# Patient Record
Sex: Female | Born: 1985 | Race: White | Hispanic: No | Marital: Single | Smoking: Former smoker
Health system: Southern US, Community
[De-identification: ages and names within clinical notes are randomized; demographics above are authoritative.]

## PROBLEM LIST (undated history)

## (undated) DIAGNOSIS — Q2730 Arteriovenous malformation, site unspecified: Secondary | ICD-10-CM

## (undated) DIAGNOSIS — G43909 Migraine, unspecified, not intractable, without status migrainosus: Secondary | ICD-10-CM

## (undated) HISTORY — PX: DILATION AND CURETTAGE OF UTERUS: SHX78

---
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
## 2006-11-03 ENCOUNTER — Inpatient Hospital Stay (HOSPITAL_COMMUNITY): Admission: AD | Admit: 2006-11-03 | Discharge: 2006-11-06 | Payer: Self-pay | Admitting: Obstetrics and Gynecology

## 2009-11-06 ENCOUNTER — Ambulatory Visit (HOSPITAL_COMMUNITY): Admission: RE | Admit: 2009-11-06 | Discharge: 2009-11-06 | Payer: Self-pay | Admitting: Family Medicine

## 2009-11-06 IMAGING — US US BREAST*L*
1 series · 4 of 4 positions shown · non-contrast
Comparison: None.

CLINICAL DATA: Palpable area of nodularity located within the left
breast at the 8 o'clock position.

LEFT BREAST ULTRASOUND

[Series 1: us breast*left* · 0.07mm/px · 4 of 4 slices shown]
[im 1/4]
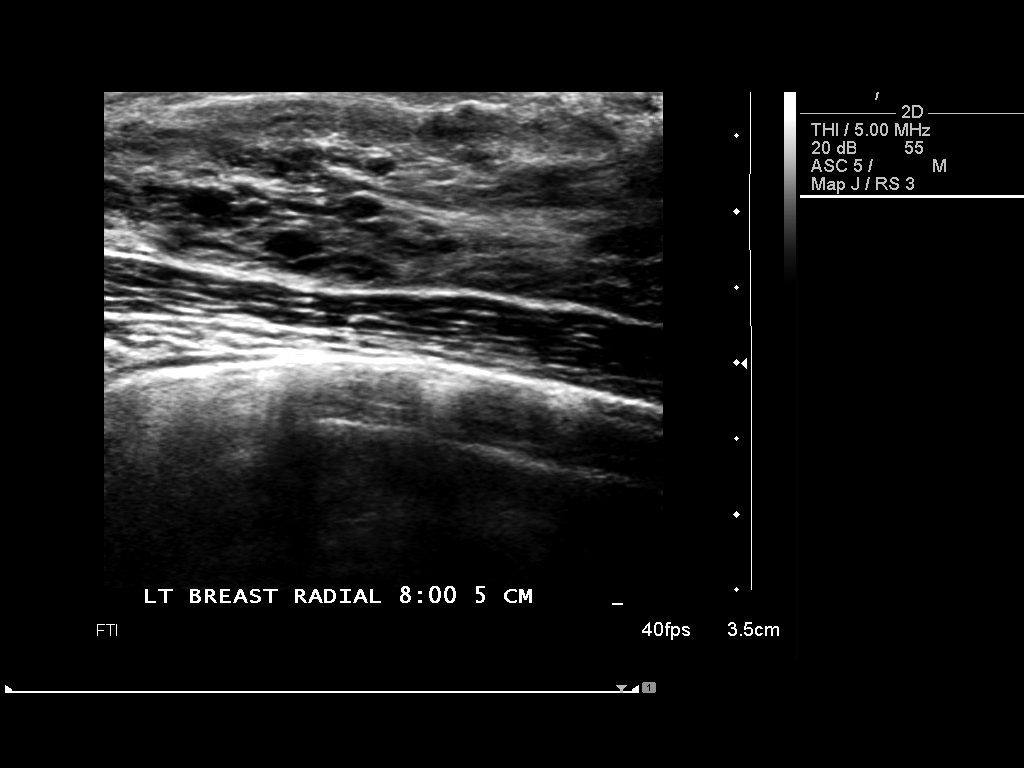
[im 2/4]
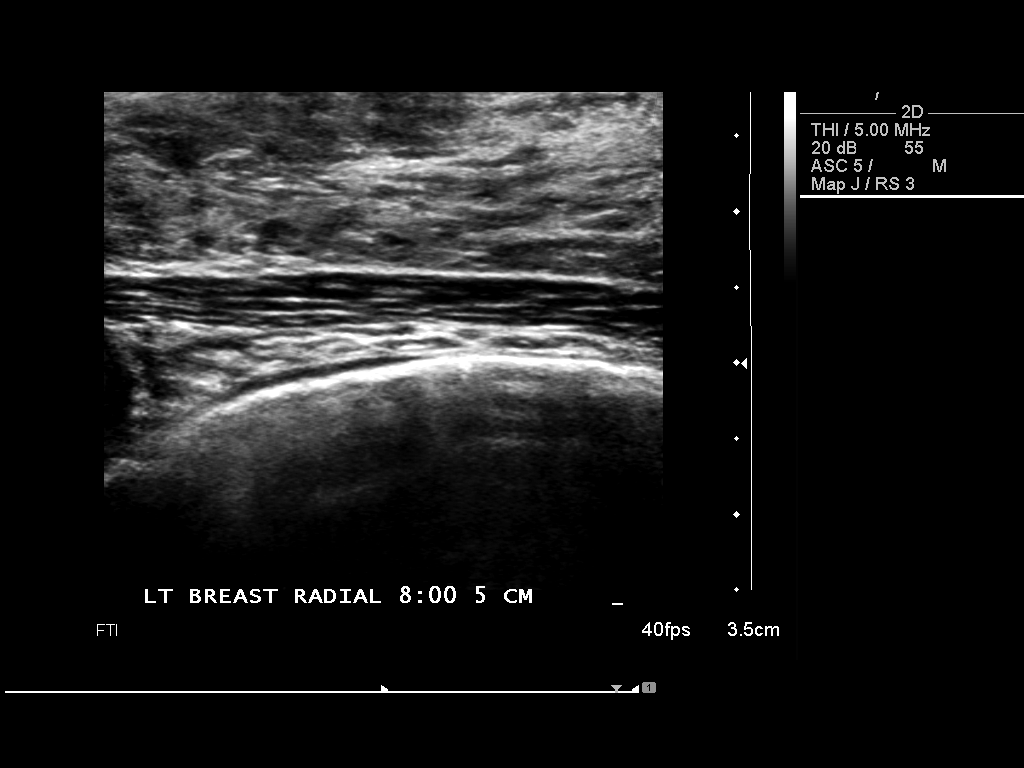
[im 3/4]
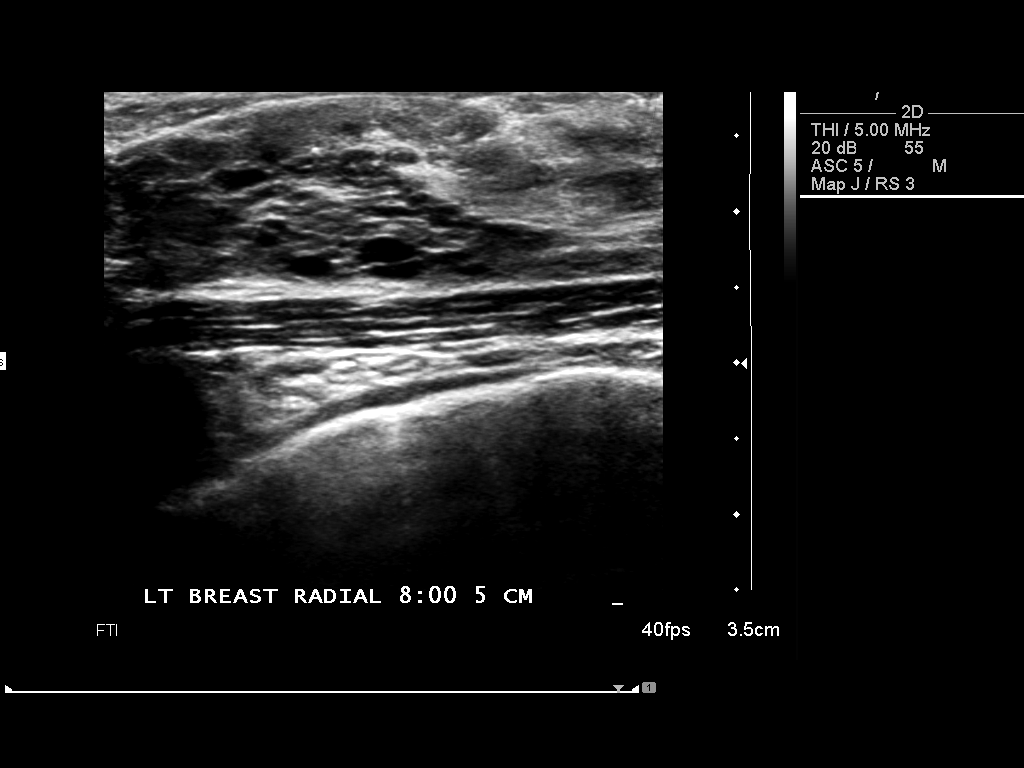
[im 4/4]
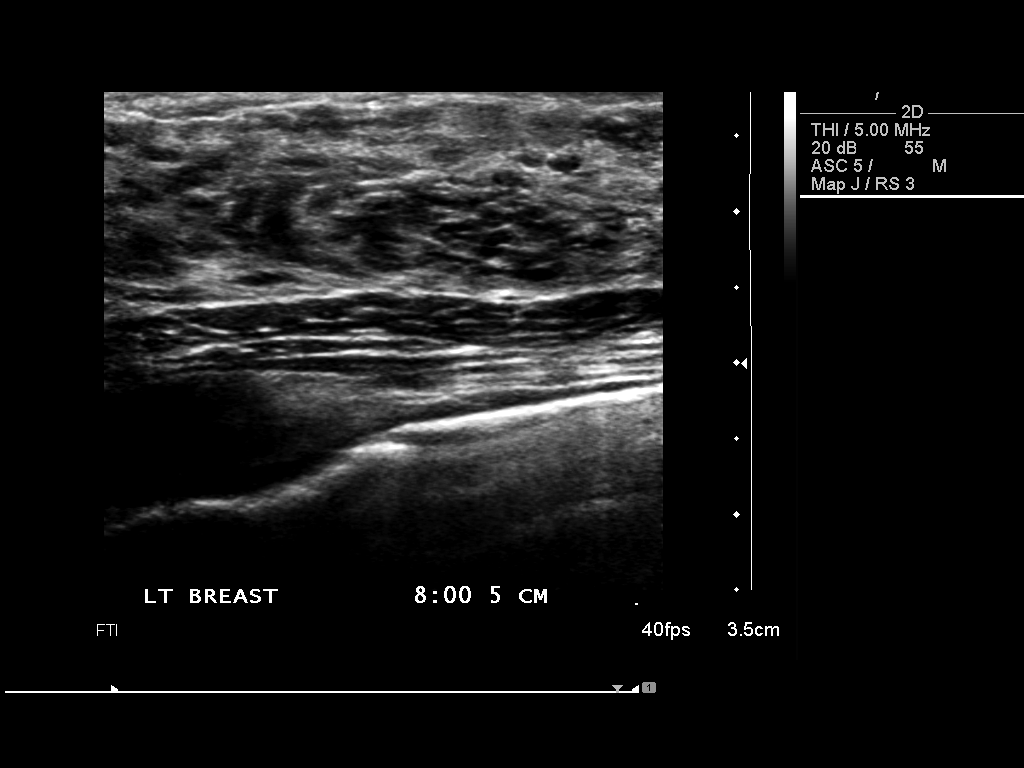

[4 of 4 positions shown; findings below may reference images not displayed]

On physical exam, there is a palpable area of thickening located
within the left breast at the 8 o'clock position 5 cm from the
nipple.
FINDINGS: Ultrasound is performed, showing prominent but normal
appearing fibroglandular tissue within the left breast at the 8
o'clock position in the area of palpable thickening noted
clinically.

The patient does state that this area fluctuates during her
menstrual cycle which would be most consistent with dense prominent
fibroglandular tissue.  There is no mass, distortion, or worrisome
shadowing.
IMPRESSION: No findings worrisome for malignancy.  The area of thickening
corresponds to a prominent fibroglandular tissue.  Recommend
screening mammography at age 41.

BI-RADS CATEGORY 1:  Negative.

## 2011-03-13 NOTE — Op Note (Signed)
NAMEMARELYN, ROUSER            ACCOUNT NO.:  [id number]   MEDICAL RECORD NO.:  [id number]          PATIENT TYPE:  INP   LOCATION:  LDR1                          FACILITY:  [facility]   PHYSICIAN:  Lazaro Arms, M.D.   DATE OF BIRTH:  [birth year]   DATE OF PROCEDURE:  11/04/2006  DATE OF DISCHARGE:                               OPERATIVE REPORT   Whitney [name] is a 25 year old gravida 1, para 0, estimated date of delivery  of October 28, 2006, at [redacted] weeks gestation who was admitted for induction  of labor.  She is now 4 cm dilated and requesting epidural.  She was  placed in a sitting position.  Betadine prep was used.  Lidocaine 1% was  injected in the L3 L4 interspace.  The area was field draped.  A 17  gauge Tuohy needle is used, loss of resistance technique employed in the  epidural space __________ without difficulty.  Bupivacaine plain, 10 mL,  0.125% is given as a test dose without ill effects.  The epidural  catheter is then fed and an additional 10 mL is given to dose up the  epidural.  Epidural catheter is then taped down 5 cm from the epidural  space.  Continuous infusion of 0.125% Bupivacaine with 2 mics/mL of  fentanyl to run at 12 mL an hour.  The patient tolerated it well, she is  getting good pain relief and the fetal heart rate tracing is stable.      Lazaro Arms, M.D.  Electronically Signed     LHE/MEDQ  D:  11/04/2006  T:  11/04/2006  Job:  [id number]

## 2011-03-13 NOTE — H&P (Signed)
NAMEMACKENZEY, CROWNOVER            ACCOUNT NO.:  [id number]   MEDICAL RECORD NO.:  [id number]          PATIENT TYPE:  INP   LOCATION:  A413                          FACILITY:  [facility]   PHYSICIAN:  Lazaro Arms, M.D.   DATE OF BIRTH:  10-02-1986   DATE OF ADMISSION:  11/03/2006  DATE OF DISCHARGE:  LH                              HISTORY & PHYSICAL   Whitney [name] is an 25 year old gravida 1, para 0 with an estimated delivery  of October 28, 2006 at 41 and [redacted] week gestation who is admitted for  induction of labor because of postdates.  Her pregnancy has been  unremarkable.   PAST MEDICAL HISTORY:  Negative.   PAST SURGICAL HISTORY:  Negative.   PAST OBSTETRICAL HISTORY:  She is nulliparous.   ALLERGIES:  NONE.   MEDICATIONS:  Prenatal vitamins.   PRENATAL LABORATORY DATA:  She is O positive, antibody screen negative,  Varicella positive, rubella immune, hepatitis B negative, HIV negative,  HSV 1 and 2 were negative, serologies nonreactive x2, GC and chlamydia  negative x2, Pap normal, Glucola normal, group B strep not listed on the  chart.  We will check on that.   IMPRESSION:  1. Intrauterine pregnancy at [redacted] weeks gestation.  2. Unfavorable cervix.   PLAN:  The patient is admitted for Foley bulb ripening followed by  Pitocin induction of labor.  She understands the indications and will  proceed.      Lazaro Arms, M.D.  Electronically Signed     LHE/MEDQ  D:  11/05/2006  T:  11/05/2006  Job:  [id number]

## 2011-03-13 NOTE — Group Therapy Note (Signed)
NAME:  CARLETTE, PALMATIER            ACCOUNT NO.:  [id number]   MEDICAL RECORD NO.:  [id number]          PATIENT TYPE:  INP   LOCATION:  LDR1                          FACILITY:  [facility]   PHYSICIAN:  Tilda Burrow, M.D. DATE OF BIRTH:  Apr 27, 1986   DATE OF PROCEDURE:  DATE OF DISCHARGE:                                 PROGRESS NOTE   Hilliard finally started progressing through labor and was noted to be  completely dilated at +3 station at approximately 1500.  After a brief  second stage, she had a spontaneous vaginal delivery of a viable female  infant at 1530 hours.  Prior to delivery a midline episiotomy was cut  due to some bradycardia issues with the fetus.  After the episiotomy she  delivered over the next contraction.  The mouth and nose were suctioned  on the perineum and the body delivered without any difficulty.  Apgars  are 9 and 9.  Weight 8 pounds 6 ounces.  20 units of Pitocin diluted in  1000 cc of lactated Ringer's was infused rapidly IV.  The placenta  separated spontaneously and delivered via controlled cord traction and  maternal pushing effort at 1533 hours.  It was inspected and appears to  be intact with a three-vessel cord.  Estimated blood loss 250 cc.  The  vagina was then inspected and no extensions of the episiotomy were  noted.  It was repaired under local anesthesia with a 2-0 Vicryl suture.  The epidural catheter was then removed with the blue tip visualized as  being intact.      Jacklyn Shell, C.N.M.      Tilda Burrow, M.D.  Electronically Signed    FC/MEDQ  D:  11/04/2006  T:  11/04/2006  Job:  [id number]   cc:   [facility]   Francoise Schaumann. Milford Cage DO, FAAP  Fax: [phone]

## 2012-03-21 ENCOUNTER — Encounter (HOSPITAL_COMMUNITY): Payer: Self-pay | Admitting: Emergency Medicine

## 2012-03-21 ENCOUNTER — Emergency Department (INDEPENDENT_AMBULATORY_CARE_PROVIDER_SITE_OTHER)
Admission: EM | Admit: 2012-03-21 | Discharge: 2012-03-21 | Disposition: A | Discharge status: Home / Self Care | Payer: Self-pay | Source: Home / Self Care | Attending: Family Medicine | Admitting: Family Medicine

## 2012-03-21 DIAGNOSIS — S61419A Laceration without foreign body of unspecified hand, initial encounter: Secondary | ICD-10-CM

## 2012-03-21 DIAGNOSIS — S61409A Unspecified open wound of unspecified hand, initial encounter: Secondary | ICD-10-CM

## 2012-03-21 MED ORDER — TETANUS-DIPHTH-ACELL PERTUSSIS 5-2.5-18.5 LF-MCG/0.5 IM SUSP
INTRAMUSCULAR | Status: AC
  Filled 2012-03-21: qty 0.5

## 2012-03-21 MED ORDER — TETANUS-DIPHTH-ACELL PERTUSSIS 5-2.5-18.5 LF-MCG/0.5 IM SUSP
0.5000 mL | Freq: Once | INTRAMUSCULAR | Status: AC
  Administered 2012-03-21: 0.5 mL via INTRAMUSCULAR

## 2012-03-21 MED ORDER — ACETAMINOPHEN-CODEINE #3 300-30 MG PO TABS: 1.0000 | ORAL_TABLET | Freq: Four times a day (QID) | ORAL | Status: AC | PRN

## 2012-03-21 NOTE — Discharge Instructions (Signed)
Keep wound clean and dry. Can remove dressing after 24 hours. Can use soap and water for cleaning. Let dry well before covering. Can applied an over-the-counter antibiotic ointment before covering. Okay to let the wound expose home but lead get wet for prolonged time. Be aware that Tylenol with codeine can make you drowsy and he should not drive after taking this medication. Return in 5-7 days for suture removal or return earlier if redness swelling or drainage.

## 2012-03-21 NOTE — ED Notes (Signed)
Unknown last tetanus
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
## 2012-03-28 ENCOUNTER — Emergency Department (INDEPENDENT_AMBULATORY_CARE_PROVIDER_SITE_OTHER)
Admission: EM | Admit: 2012-03-28 | Discharge: 2012-03-28 | Disposition: A | Discharge status: Home / Self Care | Payer: Self-pay | Source: Home / Self Care

## 2012-03-28 ENCOUNTER — Encounter (HOSPITAL_COMMUNITY): Payer: Self-pay | Admitting: Emergency Medicine

## 2012-03-28 DIAGNOSIS — Z4802 Encounter for removal of sutures: Secondary | ICD-10-CM

## 2012-03-28 NOTE — Discharge Instructions (Signed)
Thank you for coming in today.  Staple Removal Care After The staples used to close your skin have been removed. The wound needs continued care so it can heal completely and without problems. The care described here will need to be done for another 5-10 days unless your caregiver advises otherwise.  HOME CARE INSTRUCTIONS   Keep wound site dry and clean.   If skin adhesive strips were applied after the staples were removed, they will begin to peel off in a few days. If they remain after fourteen days, they may be peeled off and discarded.   If you still have a dressing, change it at least once a day or as instructed by your caregiver. If the bandage sticks, soak it off with warm water. Pat dry with a clean towel. Look for signs of infection (see below).   Reapply cream or ointment according to your caregiver's instruction. This will help prevent infection and keep the bandage from sticking. Use of a non-stick material over the wound and under the dressing or wrap will also help keep the bandage from sticking.   If the bandage becomes wet, dirty or develops a foul smell, change it as soon as possible.   New scars become sunburned easily. Use sunscreens with protection factor (SPF) of at least 15 when out in the sun.   Only take over-the-counter or prescription medicines for pain, discomfort or fever as directed by your caregiver.  SEEK IMMEDIATE MEDICAL CARE IF:   There is redness, swelling or increasing pain in the wound.   Pus is coming from the wound.   An unexplained oral temperature above 102 F (38.9 C) develops.   You notice a foul smell coming from the wound or dressing.   There is a breaking open of the suture line (edges not staying together) of the wound edges after staples have been removed.  Document Released: 09/24/2008 Document Revised: 10/01/2011 Document Reviewed: 09/24/2008 [facility] Patient Information 2012 [place], [place].

## 2012-03-28 NOTE — ED Provider Notes (Signed)
Whitney [name] is a 26 y.o. female who presents to Urgent Care today for suture removal. She suffered a laceration of her hand one week ago which required sutures. In the interim she has done well without any discharge pain or fever.   History  Substance Use Topics  . Smoking status: Current Everyday Smoker  . Smokeless tobacco: Not on file  . Alcohol Use: Yes     occasional   ROS as above Medications reviewed. No current facility-administered medications for this encounter.   Current Outpatient Prescriptions  Medication Sig Dispense Refill  . acetaminophen-codeine (TYLENOL #3) 300-30 MG per tablet Take 1-2 tablets by mouth every 6 (six) hours as needed for pain.  15 tablet  0    Exam:  BP 115/71  Pulse 84  Temp(Src) 98.7 F (37.1 C) (Oral)  Resp 16  SpO2 99% Gen: Well NAD RIGHT HAND: Small well healing laceration of the right interdigital web space between the first and second digits.  Several small interrupted sutures are present.  No discharge or erythema.  Procedure note: Small interrupted sutures were removed and Steri-Strips applied. No bleeding patient tolerated procedure well   Assessment and Plan: 26 y.o. female with suture removal. Performed today. Handout on aftercare provided. Patient expresses understanding     Rodolph Bong, MD 03/28/12 1754

## 2012-03-28 NOTE — ED Notes (Signed)
Pt ready to have stitches removed. No problems noted or reported.

## 2012-03-28 NOTE — ED Provider Notes (Signed)
Medical screening examination/treatment/procedure(s) were performed by a resident physician and as supervising physician I was immediately available for consultation/collaboration.  Leslee Home, M.D.   Reuben Likes, MD 03/28/12 2138

## 2013-05-07 ENCOUNTER — Encounter (HOSPITAL_COMMUNITY): Payer: Self-pay | Admitting: *Deleted

## 2013-05-07 ENCOUNTER — Emergency Department (HOSPITAL_COMMUNITY): Payer: Self-pay

## 2013-05-07 ENCOUNTER — Emergency Department (HOSPITAL_COMMUNITY)
Admission: EM | Admit: 2013-05-07 | Discharge: 2013-05-08 | Disposition: A | Discharge status: Home / Self Care | Payer: Self-pay | Attending: Emergency Medicine | Admitting: Emergency Medicine

## 2013-05-07 DIAGNOSIS — Q2572 Congenital pulmonary arteriovenous malformation: Secondary | ICD-10-CM | POA: Insufficient documentation

## 2013-05-07 DIAGNOSIS — Z3202 Encounter for pregnancy test, result negative: Secondary | ICD-10-CM | POA: Insufficient documentation

## 2013-05-07 DIAGNOSIS — R109 Unspecified abdominal pain: Secondary | ICD-10-CM | POA: Insufficient documentation

## 2013-05-07 DIAGNOSIS — Q2730 Arteriovenous malformation, site unspecified: Secondary | ICD-10-CM

## 2013-05-07 DIAGNOSIS — R3915 Urgency of urination: Secondary | ICD-10-CM | POA: Insufficient documentation

## 2013-05-07 DIAGNOSIS — N390 Urinary tract infection, site not specified: Secondary | ICD-10-CM | POA: Insufficient documentation

## 2013-05-07 DIAGNOSIS — F17200 Nicotine dependence, unspecified, uncomplicated: Secondary | ICD-10-CM | POA: Insufficient documentation

## 2013-05-07 DIAGNOSIS — R300 Dysuria: Secondary | ICD-10-CM | POA: Insufficient documentation

## 2013-05-07 LAB — URINALYSIS, ROUTINE W REFLEX MICROSCOPIC
Glucose, UA: NEGATIVE mg/dL
Ketones, ur: NEGATIVE mg/dL
Protein, ur: NEGATIVE mg/dL
Specific Gravity, Urine: 1.02 (ref 1.005–1.030)
Urobilinogen, UA: 0.2 mg/dL (ref 0.0–1.0)
pH: 7.5 (ref 5.0–8.0)

## 2013-05-07 LAB — URINE MICROSCOPIC-ADD ON

## 2013-05-07 LAB — CBC WITH DIFFERENTIAL/PLATELET
Basophils Absolute: 0.1 10*3/uL (ref 0.0–0.1)
Eosinophils Absolute: 0.3 10*3/uL (ref 0.0–0.7)
HCT: 37.9 % (ref 36.0–46.0)
Hemoglobin: 13.2 g/dL (ref 12.0–15.0)
Lymphocytes Relative: 15 % (ref 12–46)
Lymphs Abs: 1.7 10*3/uL (ref 0.7–4.0)
MCH: 33.5 pg (ref 26.0–34.0)
MCHC: 34.8 g/dL (ref 30.0–36.0)
MCV: 96.2 fL (ref 78.0–100.0)
Monocytes Absolute: 0.8 10*3/uL (ref 0.1–1.0)
Monocytes Relative: 7 % (ref 3–12)
Neutro Abs: 8.3 10*3/uL — ABNORMAL HIGH (ref 1.7–7.7)
Neutrophils Relative %: 74 % (ref 43–77)
Platelets: 217 10*3/uL (ref 150–400)
RBC: 3.94 MIL/uL (ref 3.87–5.11)
RDW: 13.9 % (ref 11.5–15.5)
WBC: 11.2 10*3/uL — ABNORMAL HIGH (ref 4.0–10.5)

## 2013-05-07 LAB — PREGNANCY, URINE: Preg Test, Ur: NEGATIVE

## 2013-05-07 LAB — BASIC METABOLIC PANEL
BUN: 12 mg/dL (ref 6–23)
CO2: 29 mEq/L (ref 19–32)
Chloride: 103 mEq/L (ref 96–112)
Creatinine, Ser: 0.86 mg/dL (ref 0.50–1.10)
GFR calc non Af Amer: >90 mL/min (ref >90)
Glucose, Bld: 97 mg/dL (ref 70–99)
Potassium: 4 mEq/L (ref 3.5–5.1)
Sodium: 139 mEq/L (ref 135–145)

## 2013-05-07 IMAGING — CT CT ABD-PELV W/O CM
2 of 3 series · 9 of 46 positions shown, 11 images · non-contrast
Comparison: None.

CLINICAL DATA: Left flank and abdominal pain with dysuria and
frequency since [DATE].

CT ABDOMEN AND PELVIS WITHOUT CONTRAST
TECHNIQUE: Multidetector CT imaging of the abdomen and pelvis was
performed following the standard protocol without intravenous
contrast.

[Series 4: mpr coronal (id) · coronal · 0.56mm/px · 8 of 75 slices shown, 9 images]
[im 9/75  soft-tissue]
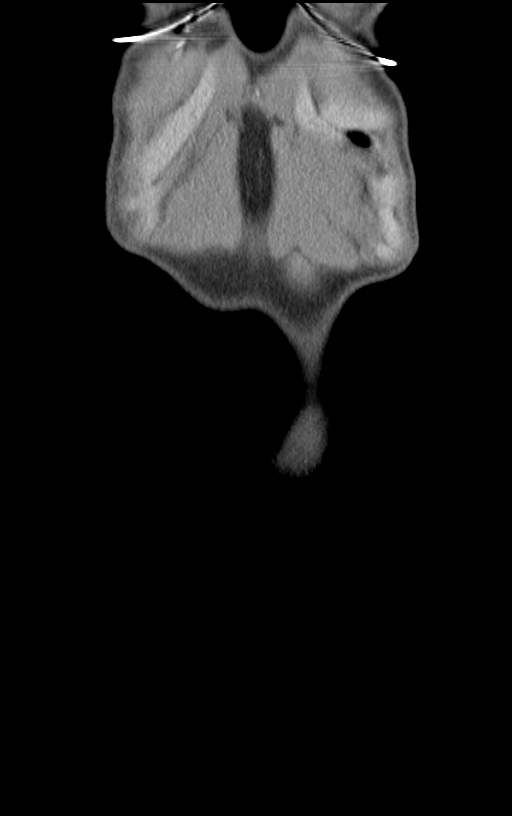
[im 9/75  bone]
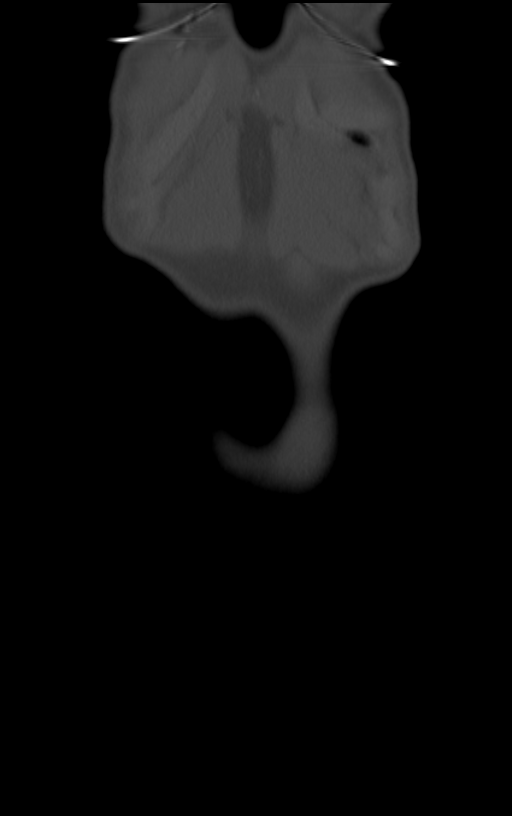
[im 17/75  soft-tissue]
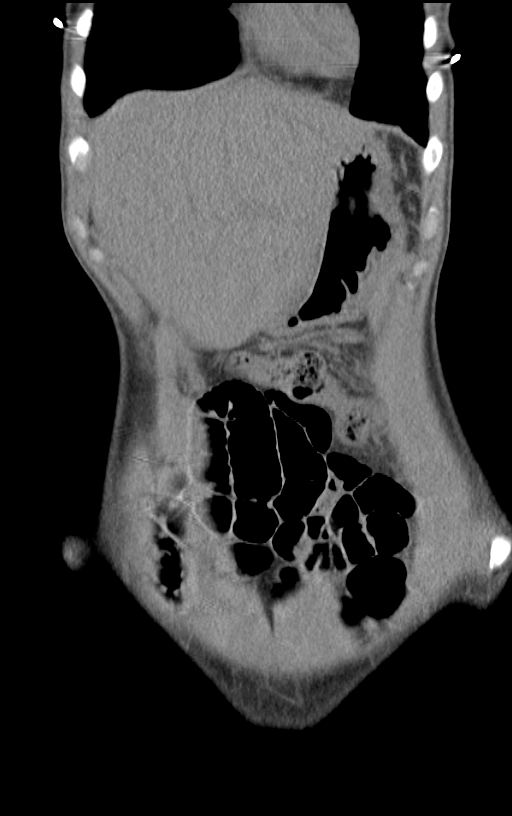
[im 25/75  soft-tissue]
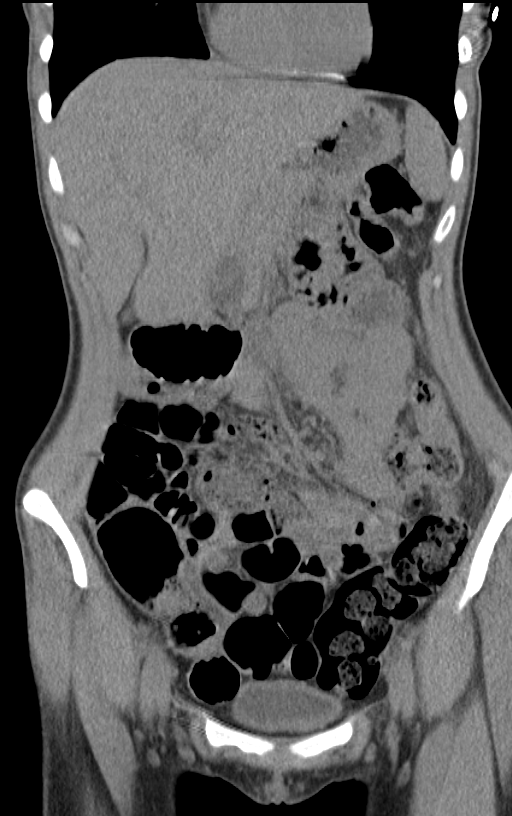
[im 33/75  soft-tissue]
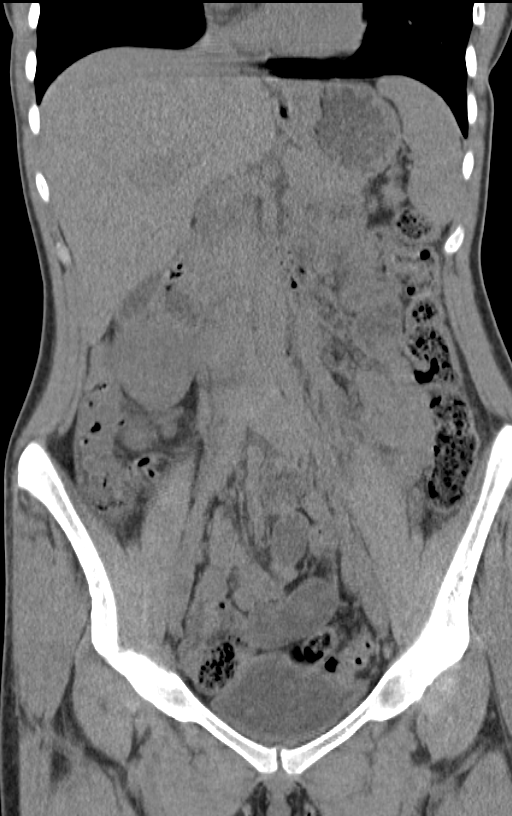
[im 42/75  soft-tissue]
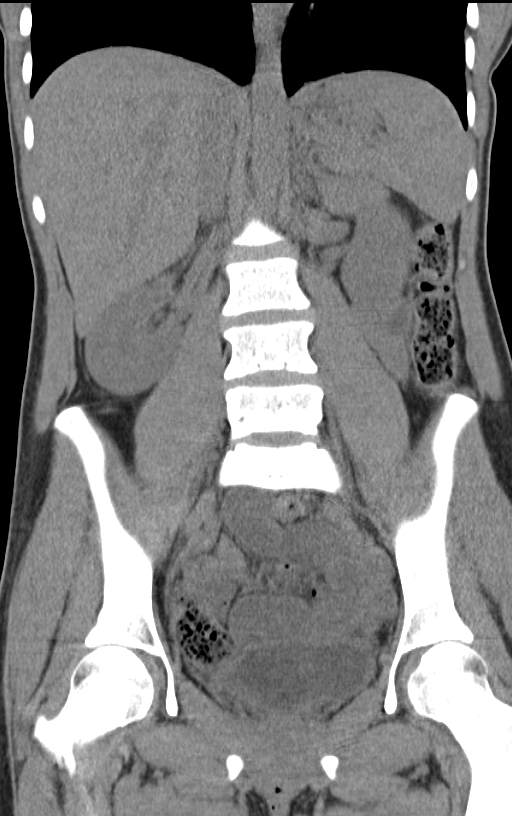
[im 50/75  soft-tissue]
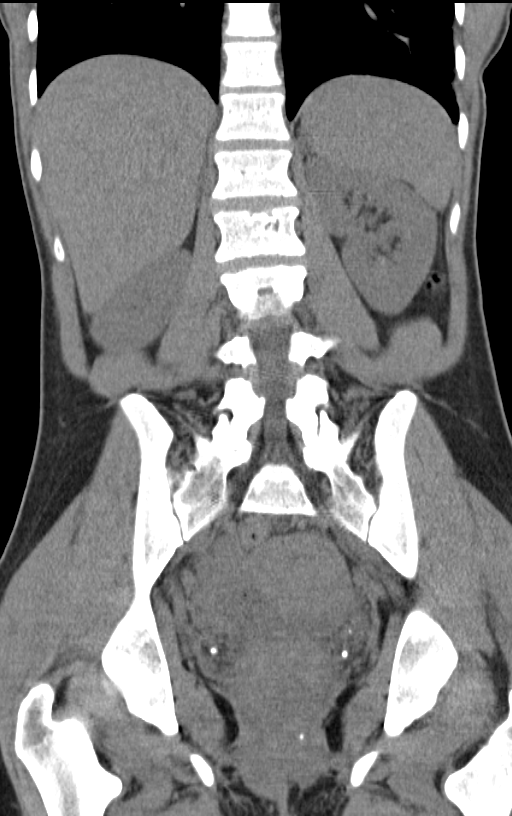
[im 58/75  soft-tissue]
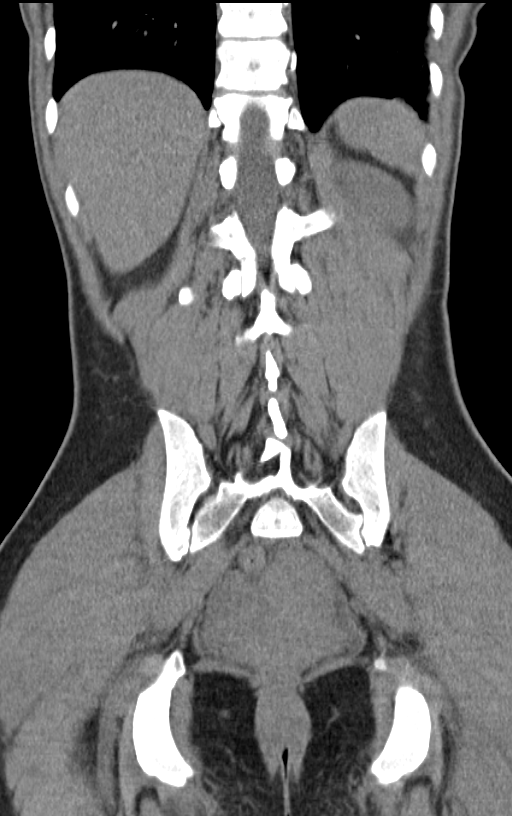
[im 66/75  soft-tissue]
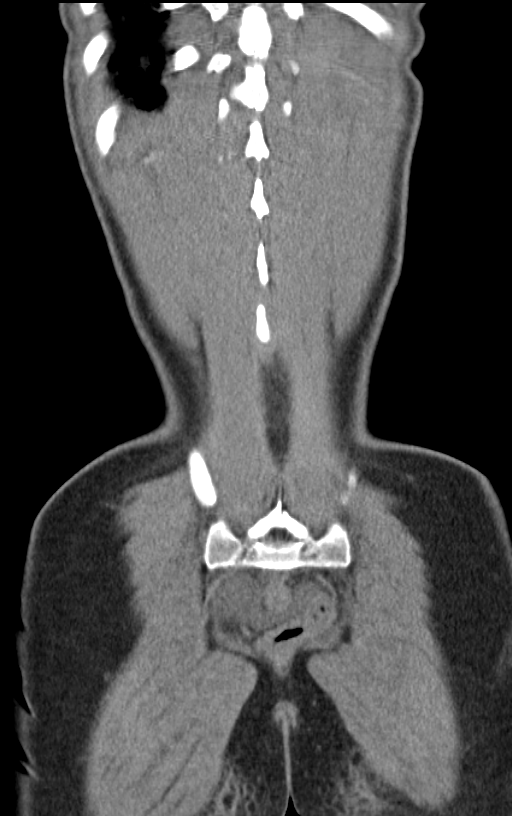

[Series 5: mpr sagittal (id) · sagittal · 0.51mm/px · 1 of 101 slices shown, 2 images]
[im 34/101  soft-tissue]
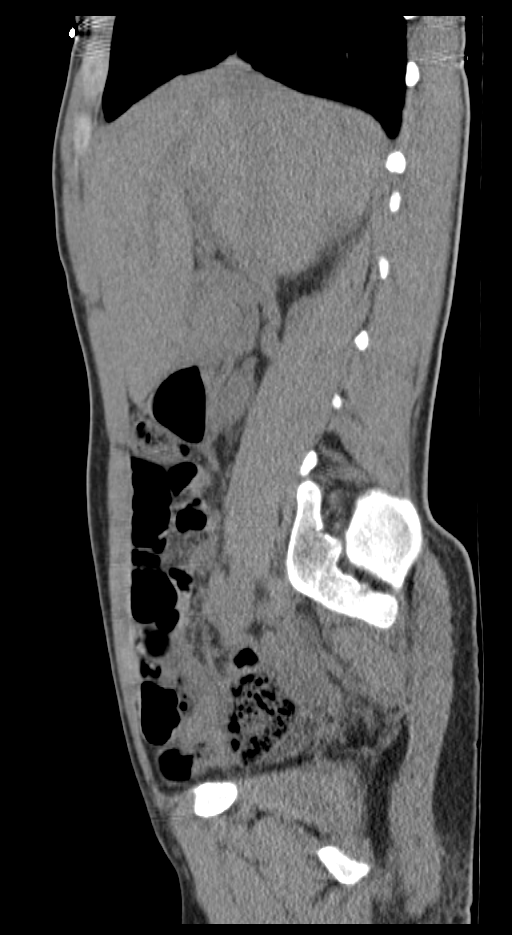
[im 34/101  bone]
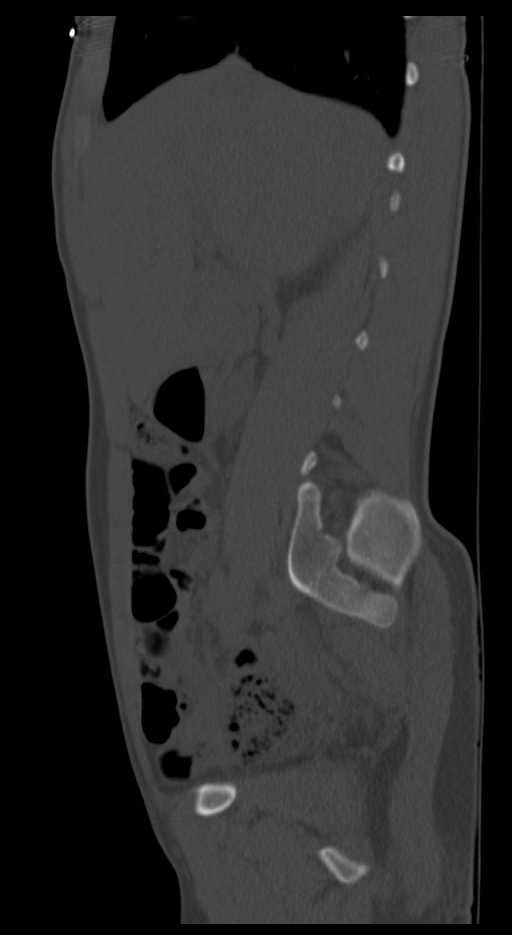

[9 of 46 positions shown; findings below may reference images not displayed]

FINDINGS: BODY WALL: Unremarkable.

LOWER CHEST:

Mediastinum: Unremarkable.

Lungs/pleura: Dilated vascular structure in the medial basal
segment right lower lobe, measuring 5 mm in maximal diameter. No
definite communication to the cava.

ABDOMEN/PELVIS:

Liver: No focal abnormality.

Biliary: No evidence of biliary obstruction or stone.

Pancreas: Unremarkable.

Spleen: Unremarkable.

Adrenals: Unremarkable.

Kidneys and ureters: No hydronephrosis or renal stone. There are
numerous calcifications in the pelvis which are likely phleboliths
based on location and lack of hydroureter.

Bladder: Unremarkable.

Bowel: No obstruction. Normal appendix.

Retroperitoneum: No mass or adenopathy.

Peritoneum: Small volume free fluid in the low pelvis.

Reproductive: Unremarkable.

Vascular: No acute abnormality.

OSSEOUS: No acute abnormalities. Numerous prominent Schmorl's nodes
throughout the imaged spine..
IMPRESSION: 1.  No obstructing urolithiasis identified.  Numerous
calcifications in the pelvis are likely phleboliths.
2.  Small volume free fluid in the pelvis.
3.  Small (5mm diameter) right lower lobe pulmonary arteriovenous
malformation/fistula.

## 2013-05-07 MED ORDER — SODIUM CHLORIDE 0.9 % IV BOLUS (SEPSIS)
1000.0000 mL | Freq: Once | INTRAVENOUS | Status: AC
  Administered 2013-05-07: 1000 mL via INTRAVENOUS

## 2013-05-07 MED ORDER — ONDANSETRON HCL 4 MG/2ML IJ SOLN
4.0000 mg | Freq: Once | INTRAMUSCULAR | Status: AC
  Administered 2013-05-07: 4 mg via INTRAVENOUS
  Filled 2013-05-07: qty 2

## 2013-05-07 MED ORDER — MORPHINE SULFATE 4 MG/ML IJ SOLN
4.0000 mg | Freq: Once | INTRAMUSCULAR | Status: AC
  Administered 2013-05-07: 4 mg via INTRAVENOUS
  Filled 2013-05-07: qty 1

## 2013-05-07 MED ORDER — CEFTRIAXONE SODIUM 1 G IJ SOLR: 1.0000 g | Freq: Once | INTRAMUSCULAR | Status: DC

## 2013-05-07 MED ORDER — DEXTROSE 5 % IV SOLN
1.0000 g | Freq: Once | INTRAVENOUS | Status: AC
  Administered 2013-05-07: 1 g via INTRAVENOUS
  Filled 2013-05-07: qty 10

## 2013-05-07 NOTE — ED Notes (Addendum)
Pt c/o left flank pain, abdominal pain, dysuria, urinary frequency, and nausea since July 4th. Denies vaginal discharge but does report vaginal odor.

## 2013-05-07 NOTE — ED Notes (Signed)
Patient reported that nausea and pain to left flank started to come back. Burgess Amor notified and stated to give repeat dose of morphine and zofran.

## 2013-05-08 MED ORDER — HYDROCODONE-ACETAMINOPHEN 5-325 MG PO TABS: 1.0000 | ORAL_TABLET | ORAL | Status: DC | PRN

## 2013-05-08 MED ORDER — IBUPROFEN 600 MG PO TABS: 600.0000 mg | ORAL_TABLET | Freq: Four times a day (QID) | ORAL | Status: DC | PRN

## 2013-05-08 MED ORDER — CEPHALEXIN 500 MG PO CAPS: 500.0000 mg | ORAL_CAPSULE | Freq: Four times a day (QID) | ORAL | Status: DC

## 2013-05-08 NOTE — ED Provider Notes (Signed)
History    CSN: [id number] Arrival date & time 05/07/13  1905  First MD Initiated Contact with Patient 05/07/13 2025     Chief Complaint  Patient presents with  . Flank Pain  . Abdominal Pain  . Dysuria  . Urinary Frequency   (Consider location/radiation/quality/duration/timing/severity/associated sxs/prior Treatment) Patient is a 27 y.o. female presenting with flank pain. The history is provided by the patient and the spouse.  Flank Pain This is a new problem. The current episode started in the past 7 days. The problem occurs constantly. The problem has been gradually worsening. Pertinent negatives include no abdominal pain, arthralgias, chest pain, congestion, fever, headaches, joint swelling, nausea, neck pain, numbness, rash, sore throat or weakness. Associated symptoms comments: She reports dysuria and increased urinary frequency.  Fever has been subjective.. Nothing aggravates the symptoms. She has tried NSAIDs for the symptoms. The treatment provided mild relief.   History reviewed. No pertinent past medical history. History reviewed. No pertinent past surgical history. History reviewed. No pertinent family history. History  Substance Use Topics  . Smoking status: Current Every Day Smoker  . Smokeless tobacco: Not on file  . Alcohol Use: Yes     Comment: occasional   OB History   Grav Para Term Preterm Abortions TAB SAB Ect Mult Living                 Review of Systems  Constitutional: Negative for fever.  HENT: Negative for congestion, sore throat and neck pain.   Eyes: Negative.   Respiratory: Negative for chest tightness and shortness of breath.   Cardiovascular: Negative for chest pain.  Gastrointestinal: Negative for nausea and abdominal pain.  Genitourinary: Positive for dysuria, urgency and frequency. Negative for vaginal bleeding, vaginal discharge and pelvic pain.  Musculoskeletal: Negative for joint swelling and arthralgias.  Skin: Negative.  Negative for  rash and wound.  Neurological: Negative for dizziness, weakness, light-headedness, numbness and headaches.  Psychiatric/Behavioral: Negative.     Allergies  Review of patient's allergies indicates no known allergies.  Home Medications   Current Outpatient Rx  Name  Route  Sig  Dispense  Refill  . ibuprofen (ADVIL,MOTRIN) 200 MG tablet   Oral   Take 800 mg by mouth every 6 (six) hours as needed for pain.         . cephALEXin (KEFLEX) 500 MG capsule   Oral   Take 1 capsule (500 mg total) by mouth 4 (four) times daily.   28 capsule   0   . HYDROcodone-acetaminophen (NORCO/VICODIN) 5-325 MG per tablet   Oral   Take 1 tablet by mouth every 4 (four) hours as needed.   20 tablet   0   . ibuprofen (ADVIL,MOTRIN) 600 MG tablet   Oral   Take 1 tablet (600 mg total) by mouth every 6 (six) hours as needed for pain.   30 tablet   0    BP 99/61  Pulse 80  Temp(Src) 98.2 F (36.8 C) (Oral)  Resp 18  Ht 5\' 11"  (1.803 m)  Wt 131 lb (59.421 kg)  BMI 18.28 kg/m2  SpO2 96%  LMP 04/20/2013 Physical Exam  Nursing note and vitals reviewed. Constitutional: She appears well-developed and well-nourished.  HENT:  Head: Normocephalic and atraumatic.  Eyes: Conjunctivae are normal.  Neck: Normal range of motion.  Cardiovascular: Normal rate, regular rhythm, normal heart sounds and intact distal pulses.   Pulmonary/Chest: Effort normal and breath sounds normal. She has no wheezes.  Abdominal: Soft.  Bowel sounds are normal. She exhibits no distension. There is no tenderness. There is CVA tenderness. There is no rebound.  cva ttp left.  Musculoskeletal: Normal range of motion.  Neurological: She is alert.  Skin: Skin is warm and dry.  Psychiatric: She has a normal mood and affect.    ED Course  Procedures (including critical care time) Labs Reviewed  URINALYSIS, ROUTINE W REFLEX MICROSCOPIC - Abnormal; Notable for the following:    APPearance CLOUDY (*)    Hgb urine dipstick  LARGE (*)    Leukocytes, UA TRACE (*)    All other components within normal limits  CBC WITH DIFFERENTIAL - Abnormal; Notable for the following:    WBC 11.2 (*)    Neutro Abs 8.3 (*)    All other components within normal limits  URINE MICROSCOPIC-ADD ON - Abnormal; Notable for the following:    Squamous Epithelial / LPF FEW (*)    Bacteria, UA FEW (*)    All other components within normal limits  URINE CULTURE  PREGNANCY, URINE  BASIC METABOLIC PANEL   Ct Abdomen Pelvis Wo Contrast  05/07/2013   *RADIOLOGY REPORT*  Clinical Data: Left flank and abdominal pain with dysuria and frequency since July 4.  CT ABDOMEN AND PELVIS WITHOUT CONTRAST  Technique:  Multidetector CT imaging of the abdomen and pelvis was performed following the standard protocol without intravenous contrast.  Comparison: None.  Findings:  BODY WALL: Unremarkable.  LOWER CHEST:  Mediastinum: Unremarkable.  Lungs/pleura: Dilated vascular structure in the medial basal segment right lower lobe, measuring 5 mm in maximal diameter. No definite communication to the cava.  ABDOMEN/PELVIS:  Liver: No focal abnormality.  Biliary: No evidence of biliary obstruction or stone.  Pancreas: Unremarkable.  Spleen: Unremarkable.  Adrenals: Unremarkable.  Kidneys and ureters: No hydronephrosis or renal stone. There are numerous calcifications in the pelvis which are likely phleboliths based on location and lack of hydroureter.  Bladder: Unremarkable.  Bowel: No obstruction. Normal appendix.  Retroperitoneum: No mass or adenopathy.  Peritoneum: Small volume free fluid in the low pelvis.  Reproductive: Unremarkable.  Vascular: No acute abnormality.  OSSEOUS: No acute abnormalities. Numerous prominent Schmorl's nodes throughout the imaged spine.  IMPRESSION:  1.  No obstructing urolithiasis identified.  Numerous calcifications in the pelvis are likely phleboliths. 2.  Small volume free fluid in the pelvis. 3.  Small (5mm diameter) right lower lobe  pulmonary arteriovenous malformation/fistula.   Original Report Authenticated By: Tiburcio Pea   1. UTI (lower urinary tract infection)     MDM  Uti without evidence of pyelo or ureteral stones.  Pt was prescribed keflex,  Hydrocodone for pain. Encouraged rest,  Increased fluids.  Recheck for worsened fever, pain, vomiting.  Referrals given for obtaining pcp.  Also discussed avm and need for pcp to follow this condition.  Burgess Amor, PA-C 05/08/13 0033  Burgess Amor, PA-C 05/08/13 [phone]

## 2013-05-09 LAB — URINE CULTURE: Colony Count: >=100000

## 2013-05-10 NOTE — ED Provider Notes (Signed)
Medical screening examination/treatment/procedure(s) were performed by non-physician practitioner and as supervising physician I was immediately available for consultation/collaboration.  Donnetta Hutching, MD 05/10/13 [phone]

## 2013-06-07 ENCOUNTER — Emergency Department (HOSPITAL_COMMUNITY)
Admission: EM | Admit: 2013-06-07 | Discharge: 2013-06-07 | Disposition: A | Discharge status: Home / Self Care | Payer: Self-pay | Attending: Emergency Medicine | Admitting: Emergency Medicine

## 2013-06-07 ENCOUNTER — Emergency Department (HOSPITAL_COMMUNITY): Payer: Self-pay

## 2013-06-07 ENCOUNTER — Encounter (HOSPITAL_COMMUNITY): Payer: Self-pay

## 2013-06-07 DIAGNOSIS — Y9389 Activity, other specified: Secondary | ICD-10-CM | POA: Insufficient documentation

## 2013-06-07 DIAGNOSIS — S0010XA Contusion of unspecified eyelid and periocular area, initial encounter: Secondary | ICD-10-CM | POA: Insufficient documentation

## 2013-06-07 DIAGNOSIS — R209 Unspecified disturbances of skin sensation: Secondary | ICD-10-CM | POA: Insufficient documentation

## 2013-06-07 DIAGNOSIS — H539 Unspecified visual disturbance: Secondary | ICD-10-CM | POA: Insufficient documentation

## 2013-06-07 DIAGNOSIS — R200 Anesthesia of skin: Secondary | ICD-10-CM

## 2013-06-07 DIAGNOSIS — S0012XA Contusion of left eyelid and periocular area, initial encounter: Secondary | ICD-10-CM

## 2013-06-07 DIAGNOSIS — Y92410 Unspecified street and highway as the place of occurrence of the external cause: Secondary | ICD-10-CM | POA: Insufficient documentation

## 2013-06-07 DIAGNOSIS — F17200 Nicotine dependence, unspecified, uncomplicated: Secondary | ICD-10-CM | POA: Insufficient documentation

## 2013-06-07 DIAGNOSIS — S0993XA Unspecified injury of face, initial encounter: Secondary | ICD-10-CM | POA: Insufficient documentation
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
## 2013-06-07 MED ORDER — OXYCODONE-ACETAMINOPHEN 5-325 MG PO TABS
1.0000 | ORAL_TABLET | Freq: Once | ORAL | Status: AC
  Administered 2013-06-07: 1 via ORAL
  Filled 2013-06-07: qty 1

## 2013-06-07 MED ORDER — LIDOCAINE-EPINEPHRINE (PF) 2 %-1:200000 IJ SOLN
INTRAMUSCULAR | Status: AC
  Filled 2013-06-07: qty 20

## 2013-06-07 MED ORDER — LIDOCAINE HCL (PF) 2 % IJ SOLN
INTRAMUSCULAR | Status: AC
  Filled 2013-06-07: qty 10

## 2013-06-07 MED ORDER — ONDANSETRON 4 MG PO TBDP
4.0000 mg | ORAL_TABLET | Freq: Once | ORAL | Status: AC
  Administered 2013-06-07: 4 mg via ORAL
  Filled 2013-06-07: qty 1

## 2013-06-07 MED ORDER — CYCLOBENZAPRINE HCL 5 MG PO TABS: 5.0000 mg | ORAL_TABLET | Freq: Three times a day (TID) | ORAL | Status: DC | PRN

## 2013-06-07 MED ORDER — TRAMADOL HCL 50 MG PO TABS: 100.0000 mg | ORAL_TABLET | Freq: Four times a day (QID) | ORAL | Status: DC | PRN

## 2013-06-07 MED ORDER — LIDOCAINE-EPINEPHRINE (PF) 1 %-1:200000 IJ SOLN
20.0000 mL | Freq: Once | INTRAMUSCULAR | Status: AC
  Administered 2013-06-07: 20 mL

## 2013-06-07 MED ORDER — LIDOCAINE-EPINEPHRINE-TETRACAINE (LET) SOLUTION
NASAL | Status: AC
  Administered 2013-06-07: 21:00:00
  Filled 2013-06-07: qty 3

## 2013-06-07 NOTE — ED Notes (Signed)
Notified Dr. Lynelle Doctor of pt.

## 2013-06-07 NOTE — ED Provider Notes (Signed)
REPAIR OF LACERATION OF THE LEFT FACE.  Patient identified by arm band. Permission for the procedure given by the patient. Procedural time out taken before repair of laceration to the left face.  The procedure was explained to the patient in terms in which she understood and she is informed and gives consent for the procedure.  The left periorbital area was painted with safe cleanse. The laceration of the left face, just below the left lower lid was infiltrated with 2% lidocaine with epinephrine. After adequate anesthetic. The wound was then cleansed and evaluated. It was found that the wound involved a very shallow skin avulsion just under the left lower lid. This is not a full-thickness laceration. The laceration measures 2.2 cm. The wound is not a candidate for suture. The wound was cleansed and irrigated with saline. Reinspected, and a sterile bandage was applied. Patient tolerated the procedure without problem.  Kathie Dike, PA-C 06/07/13 2054
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
## 2013-06-07 NOTE — ED Provider Notes (Signed)
Pt seen by me, wound examined by PA  Ward Givens, MD 06/07/13 2355
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
## 2013-06-07 NOTE — ED Notes (Signed)
EDP at bedside, let removed from wound, EDP evaluated abrasion to upper left eye-lid and laceration under left eye. Wound cleaned by said EDP. Pt tolerated well.
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
## 2013-06-14 ENCOUNTER — Emergency Department (HOSPITAL_COMMUNITY)
Admission: EM | Admit: 2013-06-14 | Discharge: 2013-06-14 | Disposition: A | Discharge status: Home / Self Care | Payer: Self-pay | Attending: Emergency Medicine | Admitting: Emergency Medicine

## 2013-06-14 ENCOUNTER — Encounter (HOSPITAL_COMMUNITY): Payer: Self-pay | Admitting: *Deleted

## 2013-06-14 DIAGNOSIS — R110 Nausea: Secondary | ICD-10-CM | POA: Insufficient documentation

## 2013-06-14 DIAGNOSIS — Z792 Long term (current) use of antibiotics: Secondary | ICD-10-CM | POA: Insufficient documentation

## 2013-06-14 DIAGNOSIS — R52 Pain, unspecified: Secondary | ICD-10-CM | POA: Insufficient documentation

## 2013-06-14 DIAGNOSIS — IMO0001 Reserved for inherently not codable concepts without codable children: Secondary | ICD-10-CM | POA: Insufficient documentation

## 2013-06-14 DIAGNOSIS — J069 Acute upper respiratory infection, unspecified: Secondary | ICD-10-CM

## 2013-06-14 DIAGNOSIS — R1310 Dysphagia, unspecified: Secondary | ICD-10-CM | POA: Insufficient documentation

## 2013-06-14 DIAGNOSIS — F17200 Nicotine dependence, unspecified, uncomplicated: Secondary | ICD-10-CM | POA: Insufficient documentation

## 2013-06-14 DIAGNOSIS — J039 Acute tonsillitis, unspecified: Secondary | ICD-10-CM

## 2013-06-14 DIAGNOSIS — R509 Fever, unspecified: Secondary | ICD-10-CM | POA: Insufficient documentation

## 2013-06-14 DIAGNOSIS — R51 Headache: Secondary | ICD-10-CM | POA: Insufficient documentation

## 2013-06-14 DIAGNOSIS — R000 Tachycardia, unspecified: Secondary | ICD-10-CM | POA: Insufficient documentation

## 2013-06-14 MED ORDER — HYDROCODONE-ACETAMINOPHEN 5-325 MG PO TABS: 1.0000 | ORAL_TABLET | ORAL | Status: DC | PRN

## 2013-06-14 MED ORDER — PENICILLIN G BENZATHINE 1200000 UNIT/2ML IM SUSP
1.2000 10*6.[IU] | Freq: Once | INTRAMUSCULAR | Status: AC
  Administered 2013-06-14: 1.2 10*6.[IU] via INTRAMUSCULAR
  Filled 2013-06-14: qty 2

## 2013-06-14 MED ORDER — IBUPROFEN 100 MG/5ML PO SUSP
400.0000 mg | Freq: Once | ORAL | Status: AC
  Administered 2013-06-14: 400 mg via ORAL
  Filled 2013-06-14: qty 20

## 2013-06-14 MED ORDER — ACETAMINOPHEN-CODEINE 120-12 MG/5ML PO SOLN
10.0000 mL | ORAL | Status: DC | PRN
  Administered 2013-06-14: 10 mL via ORAL
  Filled 2013-06-14: qty 10

## 2013-06-14 NOTE — ED Provider Notes (Signed)
CSN: [id number]     Arrival date & time 06/14/13  1210 History     First MD Initiated Contact with Patient 06/14/13 1228     Chief Complaint  Patient presents with  . Sore Throat  . Fever   (Consider location/radiation/quality/duration/timing/severity/associated sxs/prior Treatment) Patient is a 27 y.o. female presenting with pharyngitis.  Sore Throat This is a new problem. The current episode started in the past 7 days. The problem occurs daily. The problem has been gradually worsening. Associated symptoms include a fever, myalgias, nausea and a sore throat. Pertinent negatives include no abdominal pain, arthralgias, chest pain, coughing, neck pain or rash. The symptoms are aggravated by swallowing. She has tried NSAIDs for the symptoms. The treatment provided mild relief.    History reviewed. No pertinent past medical history. History reviewed. No pertinent past surgical history. No family history on file. History  Substance Use Topics  . Smoking status: Current Every Day Smoker    Types: Cigarettes  . Smokeless tobacco: Not on file  . Alcohol Use: Yes     Comment: occasional   OB History   Grav Para Term Preterm Abortions TAB SAB Ect Mult Living                 Review of Systems  Constitutional: Positive for fever. Negative for activity change.       All ROS Neg except as noted in HPI  HENT: Positive for sore throat. Negative for nosebleeds and neck pain.   Eyes: Negative for photophobia and discharge.  Respiratory: Negative for cough, shortness of breath and wheezing.   Cardiovascular: Negative for chest pain and palpitations.  Gastrointestinal: Positive for nausea. Negative for abdominal pain and blood in stool.  Genitourinary: Negative for dysuria, frequency and hematuria.  Musculoskeletal: Positive for myalgias. Negative for back pain and arthralgias.  Skin: Negative.  Negative for rash.  Neurological: Negative for dizziness, seizures and speech difficulty.   Psychiatric/Behavioral: Negative for hallucinations and confusion.    Allergies  Review of patient's allergies indicates no known allergies.  Home Medications   Current Outpatient Rx  Name  Route  Sig  Dispense  Refill  . cephALEXin (KEFLEX) 500 MG capsule   Oral   Take 1 capsule (500 mg total) by mouth 4 (four) times daily.   28 capsule   0   . cyclobenzaprine (FLEXERIL) 5 MG tablet   Oral   Take 1 tablet (5 mg total) by mouth 3 (three) times daily as needed for muscle spasms.   30 tablet   0   . HYDROcodone-acetaminophen (NORCO/VICODIN) 5-325 MG per tablet   Oral   Take 1 tablet by mouth every 4 (four) hours as needed.   20 tablet   0   . ibuprofen (ADVIL,MOTRIN) 200 MG tablet   Oral   Take 800 mg by mouth every 6 (six) hours as needed for pain.         Marland Kitchen ibuprofen (ADVIL,MOTRIN) 600 MG tablet   Oral   Take 1 tablet (600 mg total) by mouth every 6 (six) hours as needed for pain.   30 tablet   0   . traMADol (ULTRAM) 50 MG tablet   Oral   Take 2 tablets (100 mg total) by mouth every 6 (six) hours as needed.   16 tablet   0    BP 106/61  Pulse 125  Temp(Src) 100.6 F (38.1 C) (Oral)  Resp 24  Ht 5\' 11"  (1.803 m)  Wt 140 lb (  63.504 kg)  BMI 19.53 kg/m2  SpO2 99%  LMP 06/14/2013 Physical Exam  Nursing note and vitals reviewed. Constitutional: She is oriented to person, place, and time. She appears well-developed and well-nourished.  Non-toxic appearance.  HENT:  Head: Normocephalic.  Right Ear: Tympanic membrane and external ear normal.  Left Ear: Tympanic membrane and external ear normal.  Mouth/Throat: Uvula is midline. Edematous present. Oropharyngeal exudate and posterior oropharyngeal erythema present.  Nasal congestion present.  Eyes: EOM and lids are normal. Pupils are equal, round, and reactive to light.  Neck: Normal range of motion. Neck supple. Carotid bruit is not present.  Cardiovascular: Regular rhythm, normal heart sounds, intact  distal pulses and normal pulses.  Tachycardia present.   Pulmonary/Chest: Breath sounds normal. No respiratory distress.  Abdominal: Soft. Bowel sounds are normal. There is no tenderness. There is no guarding.  Musculoskeletal: Normal range of motion.  Lymphadenopathy:       Head (right side): No submandibular adenopathy present.       Head (left side): No submandibular adenopathy present.    She has no cervical adenopathy.  Neurological: She is alert and oriented to person, place, and time. She has normal strength. No cranial nerve deficit or sensory deficit.  Skin: Skin is warm and dry.  Psychiatric: She has a normal mood and affect. Her speech is normal.    ED Course   Procedures (including critical care time)  Labs Reviewed - No data to display No results found. No diagnosis found.  MDM  *I have reviewed nursing notes, vital signs, and all appropriate lab and imaging results for this patient.** Pt presents to ED with temp elevation, tachycardia, body aches. Pt has headache and pain with swallowing. Rx for norco given. 1.2 million units of bicillin given to the patient.  Kathie Dike, PA-C 06/20/13 0022  Kathie Dike, PA-C 06/20/13 [phone]

## 2013-06-14 NOTE — ED Notes (Signed)
Sore throat with low grade fever x 2 days.

## 2013-06-21 NOTE — ED Provider Notes (Signed)
Medical screening examination/treatment/procedure(s) were performed by non-physician practitioner and as supervising physician I was immediately available for consultation/collaboration.  Shelda Jakes, MD 06/21/13 [phone]

## 2013-07-21 ENCOUNTER — Other Ambulatory Visit: Payer: Self-pay | Admitting: Obstetrics & Gynecology

## 2013-07-21 DIAGNOSIS — O3680X0 Pregnancy with inconclusive fetal viability, not applicable or unspecified: Secondary | ICD-10-CM

## 2013-07-27 ENCOUNTER — Other Ambulatory Visit: Payer: Self-pay

## 2013-08-29 ENCOUNTER — Ambulatory Visit: Payer: Self-pay | Admitting: Obstetrics and Gynecology

## 2013-08-29 LAB — CBC
HCT: 37.2 % (ref 35.0–47.0)
HGB: 13.1 g/dL (ref 12.0–16.0)
MCH: 33.2 pg (ref 26.0–34.0)
MCHC: 35.4 g/dL (ref 32.0–36.0)
RDW: 13.2 % (ref 11.5–14.5)

## 2013-08-30 LAB — PATHOLOGY REPORT

## 2014-05-24 ENCOUNTER — Observation Stay: Payer: Self-pay

## 2014-09-12 ENCOUNTER — Inpatient Hospital Stay: Payer: Self-pay | Admitting: Obstetrics and Gynecology

## 2014-09-12 LAB — CBC WITH DIFFERENTIAL/PLATELET
Basophil #: 0.1 10*3/uL (ref 0.0–0.1)
Basophil %: 0.4 %
EOS ABS: 0.2 10*3/uL (ref 0.0–0.7)
Eosinophil %: 0.8 %
HCT: 32 % — AB (ref 35.0–47.0)
HGB: 10.5 g/dL — AB (ref 12.0–16.0)
LYMPHS ABS: 2.8 10*3/uL (ref 1.0–3.6)
LYMPHS PCT: 15.8 %
MCH: 32.2 pg (ref 26.0–34.0)
MCHC: 32.8 g/dL (ref 32.0–36.0)
MCV: 98 fL (ref 80–100)
MONOS PCT: 6.9 %
Monocyte #: 1.2 x10 3/mm — ABNORMAL HIGH (ref 0.2–0.9)
NEUTROS PCT: 76.1 %
Neutrophil #: 13.7 10*3/uL — ABNORMAL HIGH (ref 1.4–6.5)
PLATELETS: 408 10*3/uL (ref 150–440)
RBC: 3.26 10*6/uL — ABNORMAL LOW (ref 3.80–5.20)
RDW: 14 % (ref 11.5–14.5)
WBC: 18 10*3/uL — AB (ref 3.6–11.0)

## 2014-09-14 LAB — HEMATOCRIT: HCT: 29.3 % — ABNORMAL LOW (ref 35.0–47.0)

## 2014-09-21 ENCOUNTER — Emergency Department (HOSPITAL_COMMUNITY)
Admission: EM | Admit: 2014-09-21 | Discharge: 2014-09-21 | Disposition: A | Discharge status: Home / Self Care | Payer: Medicaid Other | Attending: Emergency Medicine | Admitting: Emergency Medicine

## 2014-09-21 ENCOUNTER — Encounter (HOSPITAL_COMMUNITY): Payer: Self-pay | Admitting: Emergency Medicine

## 2014-09-21 DIAGNOSIS — Z792 Long term (current) use of antibiotics: Secondary | ICD-10-CM | POA: Diagnosis not present

## 2014-09-21 DIAGNOSIS — Z79899 Other long term (current) drug therapy: Secondary | ICD-10-CM | POA: Diagnosis not present

## 2014-09-21 DIAGNOSIS — H9209 Otalgia, unspecified ear: Secondary | ICD-10-CM | POA: Insufficient documentation

## 2014-09-21 DIAGNOSIS — J029 Acute pharyngitis, unspecified: Secondary | ICD-10-CM | POA: Insufficient documentation

## 2014-09-21 DIAGNOSIS — H109 Unspecified conjunctivitis: Secondary | ICD-10-CM | POA: Insufficient documentation

## 2014-09-21 DIAGNOSIS — Z720 Tobacco use: Secondary | ICD-10-CM | POA: Diagnosis not present

## 2014-09-21 LAB — RAPID STREP SCREEN (MED CTR MEBANE ONLY): Streptococcus, Group A Screen (Direct): NEGATIVE

## 2014-09-21 MED ORDER — ERYTHROMYCIN 5 MG/GM OP OINT
TOPICAL_OINTMENT | Freq: Once | OPHTHALMIC | Status: AC
  Administered 2014-09-21: 10:00:00 via OPHTHALMIC
  Filled 2014-09-21: qty 3.5

## 2014-09-21 NOTE — ED Notes (Signed)
Patient with c/o right eye swelling, itching and drainage x 1 day.

## 2014-09-21 NOTE — ED Provider Notes (Signed)
CSN: [id number]     Arrival date & time 09/21/14  9794 History  This chart was scribed for Kerrie Buffalo, NP with Hurman Horn, MD by Tonye Royalty, ED Scribe. This patient was seen in room APA08/APA08 and the patient's care was started at 8:57 AM.    Chief Complaint  Patient presents with  . Conjunctivitis   Patient is a 28 y.o. female presenting with conjunctivitis. The history is provided by the patient. No language interpreter was used.  Conjunctivitis This is a new problem. The current episode started yesterday. The problem occurs constantly. The problem has not changed since onset.Nothing aggravates the symptoms. Nothing relieves the symptoms. She has tried nothing for the symptoms.    HPI Comments: Whitney [name] is a 28 y.o. female who presents to the Emergency Department complaining of right eye eye redness and itching upon waking yesterday. She believes it is pinkeye. She notes she has a young child at home. She reports associated sore throat, drainage, and ear pain.   History reviewed. No pertinent past medical history. Past Surgical History  Procedure Laterality Date  . Dilation and curettage of uterus     No family history on file. History  Substance Use Topics  . Smoking status: Current Every Day Smoker    Types: Cigarettes  . Smokeless tobacco: Not on file  . Alcohol Use: No     Comment: occasional   OB History    No data available     Review of Systems  HENT: Positive for ear pain and sore throat.   Eyes: Positive for discharge, redness and itching. Negative for visual disturbance.   All other systems negative   Allergies  Review of patient's allergies indicates no known allergies.  Home Medications   Prior to Admission medications   Medication Sig Start Date End Date Taking? Authorizing Provider  cephALEXin (KEFLEX) 500 MG capsule Take 1 capsule (500 mg total) by mouth 4 (four) times daily. 05/08/13   Burgess Amor, PA-C  cyclobenzaprine (FLEXERIL) 5 MG  tablet Take 1 tablet (5 mg total) by mouth 3 (three) times daily as needed for muscle spasms. 06/07/13   Ward Givens, MD  HYDROcodone-acetaminophen (NORCO/VICODIN) 5-325 MG per tablet Take 1 tablet by mouth every 4 (four) hours as needed. 05/08/13   Burgess Amor, PA-C  HYDROcodone-acetaminophen (NORCO/VICODIN) 5-325 MG per tablet Take 1 tablet by mouth every 4 (four) hours as needed for pain. 06/14/13   Kathie Dike, PA-C  ibuprofen (ADVIL,MOTRIN) 200 MG tablet Take 800 mg by mouth every 6 (six) hours as needed for pain.    Historical Provider, MD  ibuprofen (ADVIL,MOTRIN) 600 MG tablet Take 1 tablet (600 mg total) by mouth every 6 (six) hours as needed for pain. 05/08/13   Burgess Amor, PA-C  traMADol (ULTRAM) 50 MG tablet Take 2 tablets (100 mg total) by mouth every 6 (six) hours as needed. 06/07/13   Ward Givens, MD   BP 110/76 mmHg  Pulse 88  Temp(Src) 97.9 F (36.6 C) (Oral)  Resp 16  Ht 5\' 11"  (1.803 m)  Wt 145 lb (65.772 kg)  BMI 20.23 kg/m2  SpO2 99% Physical Exam  Constitutional: She is oriented to person, place, and time. She appears well-developed and well-nourished.  HENT:  Head: Normocephalic and atraumatic.  Right Ear: Tympanic membrane normal.  Left Ear: Tympanic membrane normal.  Mouth/Throat: Uvula is midline and mucous membranes are normal. Posterior oropharyngeal erythema present.  Eyes: EOM are normal. Pupils are equal, round,  and reactive to light. Right eye exhibits discharge. Right conjunctiva is injected.  Right conjunctiva injected There is crusty purulent drainage to the lids  Neck: Normal range of motion. Neck supple.  Cardiovascular: Normal rate and regular rhythm.   Pulmonary/Chest: Effort normal and breath sounds normal. No respiratory distress. She has no wheezes. She has no rales.  Musculoskeletal: Normal range of motion.  Lymphadenopathy:    She has no cervical adenopathy.  Neurological: She is alert and oriented to person, place, and time.  Skin: Skin is  warm and dry.  Psychiatric: She has a normal mood and affect. Her behavior is normal.  Nursing note and vitals reviewed.   ED Course  Procedures (including critical care time)  DIAGNOSTIC STUDIES: Oxygen Saturation is 99% on room air, normal by my interpretation.    COORDINATION OF CARE: 9:00 AM Discussed treatment plan with patient at beside, including strep test the patient agrees with the plan and has no further questions at this time.  Results for orders placed or performed during the hospital encounter of 09/21/14 (from the past 24 hour(s))  Rapid strep screen     Status: None   Collection Time: 09/21/14  9:13 AM  Result Value Ref Range   Streptococcus, Group A Screen (Direct) NEGATIVE NEGATIVE     MDM  28 y.o. female with right eye irritation,crusty discharge and sore throat. Will treat for conjunctivitis with Erythromycin Opth Ointment. First dose instilled prior to d/c. Patient to continue to use BID x 7 days. She will follow up with her PCP or return here as needed. Discussed with the patient and all questioned fully answered.  I personally performed the services described in this documentation, which was scribed in my presence. The recorded information has been reviewed and is accurate.  NampaHope M Jes Costales, NP 09/21/14 1429  Hurman HornJohn M Bednar, MD 09/21/14 2322

## 2014-09-21 NOTE — Discharge Instructions (Signed)
Use the eye ointment 3 time a day for one week. Apply cool compresses to the eye.  Conjunctivitis Conjunctivitis is commonly called "pink eye." Conjunctivitis can be caused by bacterial or viral infection, allergies, or injuries. There is usually redness of the lining of the eye, itching, discomfort, and sometimes discharge. There may be deposits of matter along the eyelids. A viral infection usually causes a watery discharge, while a bacterial infection causes a yellowish, thick discharge. Pink eye is very contagious and spreads by direct contact. You may be given antibiotic eyedrops as part of your treatment. Before using your eye medicine, remove all drainage from the eye by washing gently with warm water and cotton balls. Continue to use the medication until you have awakened 2 mornings in a row without discharge from the eye. Do not rub your eye. This increases the irritation and helps spread infection. Use separate towels from other household members. Wash your hands with soap and water before and after touching your eyes. Use cold compresses to reduce pain and sunglasses to relieve irritation from light. Do not wear contact lenses or wear eye makeup until the infection is gone. SEEK MEDICAL CARE IF:   Your symptoms are not better after 3 days of treatment.  You have increased pain or trouble seeing.  The outer eyelids become very red or swollen. Document Released: 11/19/2004 Document Revised: 01/04/2012 Document Reviewed: 10/12/2005 [facility] Patient Information 2015 [place], [place]. This information is not intended to replace advice given to you by your health care provider. Make sure you discuss any questions you have with your health care provider.   Your strep screen today was negative

## 2014-09-23 LAB — CULTURE, GROUP A STREP

## 2015-02-15 NOTE — Op Note (Signed)
PATIENT NAME:  Whitney [name], Whitney [name] MR#:  [id number] DATE OF BIRTH:  [birth year]  DATE OF PROCEDURE:  08/29/2013  PREOPERATIVE DIAGNOSIS: Missed abortion at 8 weeks' gestation.   POSTOPERATIVE DIAGNOSIS:  Missed abortion at 8 weeks' gestation.   PROCEDURE: Suction dilatation and curettage.   ANESTHESIA USED: General.   PRIMARY SURGEON: Florina Ou. Bonney Aid, MD  ESTIMATED BLOOD LOSS:  1200 mL.   PREOPERATIVE ANTIBIOTICS:  Doxycycline 100 mg.   POSTOPERATIVE ANTIBIOTICS:  Doxycycline 200 mg p.o.   DRAINS OR TUBES: None.   IMPLANTS: None.   COMPLICATIONS:  Intraoperative hemorrhage.    INTRAOPERATIVE FINDINGS: The uterus sounded to 12 cm, approximately 12-week size, anteverted. Suctioned curettage was undertaken using a size 8 flexible suction curette, yielding a large amount of tissue.   SPECIMENS REMOVED: Products of conception.   PATIENT CONDITION FOLLOWING PROCEDURE: Stable.   PROCEDURE IN DETAIL: Risks, benefits and alternatives of the procedure were discussed with the patient prior to proceeding to the Operating Room. The patient was taken to the Operating Room where she was placed under general anesthesia using an LMA airway. She was positioned in the dorsal lithotomy position using Allen stirrups, prepped and draped in the usual sterile fashion. A timeout procedure was performed. Attention was turned to the patient's pelvis. The bladder was straight catheterized with a red rubber catheter and an operative speculum was placed to visualize the cervix. The anterior lip of the cervix was grasped with a single-tooth tenaculum and then sequentially dilated using Hegar dilators. Once the cervix was sufficiently dilated to allow passage of the size 8 suction curette, several passes with the curette were undertaken, yielding a large amount of tissue. The specimens actually required 3 canisters, partially because of blood loss and also because of the amount of tissue that was removed. Following  the suction curettage, sharp curettage was undertaken. Good uterine cry was noted throughout the uterus. The single-tooth tenaculum was removed. The cervix and tenaculum sites were inspected and noted to be hemostatic. Bimanual exam at the end of the case revealed a nicely contracted, approximately 10-week size uterus. Sponge, needle and instrument counts were correct x 2. The patient tolerated the procedure well and was taken to the recovery room in stable condition.    ____________________________ Florina Ou. Bonney Aid, MD ams:cs D: 08/29/2013 14:52:46 ET T: 08/29/2013 15:20:40 ET JOB#: [id number]  cc: Florina Ou. Bonney Aid, MD, <Dictator> [place] Cathrine Muster MD ELECTRONICALLY SIGNED 09/06/2013 8:57

## 2015-02-18 LAB — SURGICAL PATHOLOGY

## 2015-03-05 NOTE — H&P (Signed)
L&D Evaluation:  History:  HPI 29 year old G3 P1011 with EDC=09/09/2014 by a LMP=12/03/2013 (EDC=11/20 by a 6wk3d ultrasound) presents to L&D for IOL at 40 3/7 weeks due to postdates and advanced cervical dilation. She has mild irregular contractions and denies VB or LOF. Baby active. PNC at [facility] remarkable for a negative first trimester screen and a normal anatomy scan. Patient has smoked 1 PPD cigs throughout her pregnancy. She desires to breastfeed. LABS: O POS, RI, VI, GBS negative.   Presents with postdates for IOL   Patient's Medical History Anxiety/depression   Patient's Surgical History D&C   Medications none   Allergies NKDA   Social History tobacco  1 PPD   Family History Non-Contributory   ROS:  ROS see HPI   Exam:  Vital Signs stable   General no apparent distress   Mental Status clear   Chest clear   Heart normal sinus rhythm, no murmur/gallop/rubs   Abdomen gravid, non-tender   Estimated Fetal Weight Average for gestational age, 8#   Fetal Position cephalic on US   Pelvic no external lesions, 4/80%/fetal head not palpable due to bulging bow   Mebranes Intact   FHT normal rate with no decels, 135 WITH ACCELS TO 160S   FHT Description Cat 1   Ucx q5-6, mild   Skin dry   Impression:  Impression IUP at 40 3/7 weeks for IOL   Plan:  Plan EFM/NST, monitor contractions and for cervical change, Discussed risks of IOL including hyperstimulation, fetal intolerance, and C-section. Patient wishes to proceed with  Pitocin. Plan AROM once head is better applied to cervix. Stadol for pain. Epidural if desires when more active.   Electronic Signatures: Trinna Balloon (CNM)  (Signed [phone] 14:34)  Authored: L&D Evaluation   Last Updated: 18-Nov-15 14:34 by Trinna Balloon (CNM)

## 2015-03-05 NOTE — H&P (Signed)
L&D Evaluation:  History:  HPI Pt is a 29 yo G3P1011 at 2.[redacted] weeks GA with an EDC of 09/09/14 who presents to L&D after running into a shopping cart yesterday. She also reports decreased fetal movement. She denies vb, lof or ctx. She does reports some tightening at times. She reports that she has not had anything to drink or eat this morning. She is O+, RI, VI.   Presents with abdominal pain   Patient's Medical History anxiety, depression    Patient's Surgical History D&C    Medications Pre Natal Vitamins    Allergies NKDA   Social History none    Family History Non-Contributory    ROS:  ROS All systems were reviewed.  HEENT, CNS, GI, GU, Respiratory, CV, Renal and Musculoskeletal systems were found to be normal.   Exam:  Vital Signs stable    Abdomen gravid, non-tender   Back no CVAT   Pelvic no external lesions   Mebranes Intact   FHT normal rate with no decels, 130's, 10x10 accels noted   Ucx absent   Skin dry, no lesions, no rashes   Lymph no lymphadenopathy    Impression:  Impression IUP at 24.6 weeks, reactive NST for gestational age.   Plan:  Plan discharge, s/sx of abruption discussed. F/U at prenatal appointment or prn   Follow Up Appointment already scheduled. in 3 weeks   Electronic Signatures: Jannet Mantis (CNM)  (Signed 30-Jul-15 12:51)  Authored: L&D Evaluation   Last Updated: 30-Jul-15 12:51 by Jannet Mantis (CNM)

## 2015-10-22 ENCOUNTER — Emergency Department (HOSPITAL_COMMUNITY)
Admission: EM | Admit: 2015-10-22 | Discharge: 2015-10-22 | Disposition: A | Discharge status: Home / Self Care | Payer: Medicaid Other | Attending: Emergency Medicine | Admitting: Emergency Medicine

## 2015-10-22 ENCOUNTER — Encounter (HOSPITAL_COMMUNITY): Payer: Self-pay | Admitting: Emergency Medicine

## 2015-10-22 DIAGNOSIS — K0889 Other specified disorders of teeth and supporting structures: Secondary | ICD-10-CM | POA: Diagnosis present

## 2015-10-22 DIAGNOSIS — F17210 Nicotine dependence, cigarettes, uncomplicated: Secondary | ICD-10-CM | POA: Diagnosis not present

## 2015-10-22 DIAGNOSIS — K029 Dental caries, unspecified: Secondary | ICD-10-CM | POA: Insufficient documentation

## 2015-10-22 MED ORDER — AMOXICILLIN 875 MG PO TABS: 875.0000 mg | ORAL_TABLET | Freq: Two times a day (BID) | ORAL | Status: DC

## 2015-10-22 NOTE — ED Provider Notes (Signed)
CSN: [id number]     Arrival date & time 10/22/15  1123 History   First MD Initiated Contact with Patient 10/22/15 1146     Chief Complaint  Patient presents with  . Dental Pain     (Consider location/radiation/quality/duration/timing/severity/associated sxs/prior Treatment) HPI   Whitney [name] is a 29 y.o. female who presents to the Emergency Department complaining of dental pain for 2 weeks.  She states the pain is constant and worse with cold fluids.  She describes a sharp pain the the left upper jaw, face and radiates to the ear.  She is taking tylenol and ibuprofen with moderate relief.  She denies fever, facial swelling, neck pain or chills.   History reviewed. No pertinent past medical history. Past Surgical History  Procedure Laterality Date  . Dilation and curettage of uterus     Family History  Problem Relation Age of Onset  . Gout Father   . Hyperlipidemia Father   . Diabetes Other   . Cancer Other    Social History  Substance Use Topics  . Smoking status: Current Every Day Smoker -- 0.75 packs/day    Types: Cigarettes  . Smokeless tobacco: Never Used  . Alcohol Use: No     Comment: occasional   OB History    Gravida Para Term Preterm AB TAB SAB Ectopic Multiple Living   Review of Systems  Constitutional: Negative for fever and appetite change.  HENT: Positive for dental problem. Negative for congestion, facial swelling, sore throat and trouble swallowing.   Eyes: Negative for pain and visual disturbance.  Musculoskeletal: Negative for neck pain and neck stiffness.  Neurological: Negative for dizziness, facial asymmetry and headaches.  Hematological: Negative for adenopathy.  All other systems reviewed and are negative.     Allergies  Review of patient's allergies indicates no known allergies.  Home Medications   Prior to Admission medications   Medication Sig Start Date End Date Taking? Authorizing Provider  acetaminophen  (TYLENOL) 500 MG tablet Take 1,000 mg by mouth every 6 (six) hours as needed for moderate pain.   Yes Historical Provider, MD  ibuprofen (ADVIL,MOTRIN) 200 MG tablet Take 800 mg by mouth every 6 (six) hours as needed for pain.   Yes Historical Provider, MD  medroxyPROGESTERone (DEPO-PROVERA) 150 MG/ML injection Inject 150 mg into the muscle every 3 (three) months.   Yes Historical Provider, MD   BP 114/73 mmHg  Pulse 104  Temp(Src) 98.1 F (36.7 C) (Oral)  Resp 16  Ht  (1.803 m)  Wt 62.596 kg  BMI 19.26 kg/m2  SpO2 100% Physical Exam  Constitutional: She is oriented to person, place, and time. She appears well-developed and well-nourished. No distress.  HENT:  Head: Normocephalic and atraumatic.  Right Ear: Tympanic membrane and ear canal normal.  Left Ear: Tympanic membrane and ear canal normal.  Mouth/Throat: Uvula is midline, oropharynx is clear and moist and mucous membranes are normal. No trismus in the jaw. Dental caries present. No dental abscesses or uvula swelling.  Dental caries and ttp of the left upper third molar.  No facial swelling, obvious dental abscess, trismus, or sublingual abnml.    Neck: Normal range of motion. Neck supple.  Cardiovascular: Normal rate, regular rhythm and normal heart sounds.   No murmur heard. Pulmonary/Chest: Effort normal and breath sounds normal.  Musculoskeletal: Normal range of motion.  Lymphadenopathy:    She has no cervical adenopathy.  Neurological: She is alert and oriented to person, place, and time. She exhibits normal muscle tone. Coordination normal.  Skin: Skin is warm and dry.  Nursing note and vitals reviewed.   ED Course  Procedures (including critical care time) Labs Review Labs Reviewed - No data to display  Imaging Review No results found. I have personally reviewed and evaluated these images and lab results as part of my medical decision-making.   EKG Interpretation None      MDM   Final diagnoses:   Pain, dental    Pt is well appearing.  Vitals stable.  No concerning sx's for Ludwig's angina.  No abscess.  Pt agrees to close dental f/u.  rx for amoxil    Pauline Aus, PA-C 10/25/15 2014  Glynn Octave, MD 10/25/15 2151

## 2015-10-22 NOTE — ED Notes (Signed)
Pt c/o L upper jaw dental pain that started 2 weeks ago. Pt states she thinks her wisdom tooth is abscessed.

## 2016-10-21 ENCOUNTER — Emergency Department (HOSPITAL_COMMUNITY)
Admission: EM | Admit: 2016-10-21 | Discharge: 2016-10-21 | Disposition: A | Discharge status: Home / Self Care | Payer: Medicaid Other | Attending: Emergency Medicine | Admitting: Emergency Medicine

## 2016-10-21 ENCOUNTER — Encounter (HOSPITAL_COMMUNITY): Payer: Self-pay

## 2016-10-21 DIAGNOSIS — J029 Acute pharyngitis, unspecified: Secondary | ICD-10-CM | POA: Insufficient documentation

## 2016-10-21 DIAGNOSIS — R509 Fever, unspecified: Secondary | ICD-10-CM | POA: Insufficient documentation

## 2016-10-21 DIAGNOSIS — F17210 Nicotine dependence, cigarettes, uncomplicated: Secondary | ICD-10-CM | POA: Insufficient documentation

## 2016-10-21 DIAGNOSIS — R05 Cough: Secondary | ICD-10-CM | POA: Insufficient documentation

## 2016-10-21 DIAGNOSIS — R6889 Other general symptoms and signs: Secondary | ICD-10-CM

## 2016-10-21 MED ORDER — ACETAMINOPHEN 500 MG PO TABS
1000.0000 mg | ORAL_TABLET | Freq: Once | ORAL | Status: AC
  Administered 2016-10-21: 1000 mg via ORAL

## 2016-10-21 MED ORDER — ACETAMINOPHEN 500 MG PO TABS
ORAL_TABLET | ORAL | Status: AC
  Filled 2016-10-21: qty 2

## 2016-10-21 MED ORDER — OSELTAMIVIR PHOSPHATE 75 MG PO CAPS: 75.0000 mg | ORAL_CAPSULE | Freq: Two times a day (BID) | ORAL | 0 refills | Status: DC

## 2016-10-21 MED ORDER — IBUPROFEN 400 MG PO TABS
600.0000 mg | ORAL_TABLET | Freq: Once | ORAL | Status: AC
  Administered 2016-10-21: 600 mg via ORAL
  Filled 2016-10-21: qty 2

## 2016-10-21 NOTE — ED Triage Notes (Signed)
Patient complains of body aches and fever this morning with diarrhea. Denies N/V.

## 2016-10-21 NOTE — ED Triage Notes (Signed)
Reports of waking up today with generalized body aches, fever and cough for past few days. Also states she had diarrhea but not this morning. Patient states when she walked into triage she became light headed.

## 2016-10-23 NOTE — ED Provider Notes (Signed)
[facility] Provider Note   CSN: [id number] Arrival date & time: 10/21/16  1252     History   Chief Complaint Chief Complaint  Patient presents with  . Fever  . Generalized Body Aches  . Cough    HPI Whitney [name] is a 30 y.o. female.  HPI   30yF with fever, cough, body aches and mild sore throat. Onset about 3d ago. Persistent since then. Everything hurts. Even just her clothes touching against her body feels very uncomfortable. Several sick contacts. No v/d. No rash. No urinary complaints. Lightheaded at times.   History reviewed. No pertinent past medical history.  There are no active problems to display for this patient.   Past Surgical History:  Procedure Laterality Date  . DILATION AND CURETTAGE OF UTERUS      OB History    Gravida Para Term Preterm AB Living   3 2 2   1      SAB TAB Ectopic Multiple Live Births   1               Home Medications    Prior to Admission medications   Medication Sig Start Date End Date Taking? Authorizing Provider  acetaminophen (TYLENOL) 500 MG tablet Take 1,000 mg by mouth every 6 (six) hours as needed for moderate pain.    Historical Provider, MD  amoxicillin (AMOXIL) 875 MG tablet Take 1 tablet (875 mg total) by mouth 2 (two) times daily. For 7 days 10/22/15   Tammy Triplett, PA-C  ibuprofen (ADVIL,MOTRIN) 200 MG tablet Take 800 mg by mouth every 6 (six) hours as needed for pain.    Historical Provider, MD  medroxyPROGESTERone (DEPO-PROVERA) 150 MG/ML injection Inject 150 mg into the muscle every 3 (three) months.    Historical Provider, MD  oseltamivir (TAMIFLU) 75 MG capsule Take 1 capsule (75 mg total) by mouth every 12 (twelve) hours. 10/21/16   Raeford Razor, MD    Family History Family History  Problem Relation Age of Onset  . Gout Father   . Hyperlipidemia Father   . Diabetes Other   . Cancer Other     Social History Social History  Substance Use Topics  . Smoking status: Current Every  Day Smoker    Packs/day: 0.75    Types: Cigarettes  . Smokeless tobacco: Never Used  . Alcohol use No     Comment: occasional     Allergies   Patient has no known allergies.   Review of Systems Review of Systems  All systems reviewed and negative, other than as noted in HPI.  Physical Exam Updated Vital Signs BP 97/58   Pulse 105   Temp 100.7 F (38.2 C)   Resp 18   Ht 5\' 11"  (1.803 m)   Wt 135 lb (61.2 kg)   SpO2 96%   BMI 18.83 kg/m   Physical Exam  Constitutional: She is oriented to person, place, and time. She appears well-developed and well-nourished. No distress.  HENT:  Head: Normocephalic and atraumatic.  Right Ear: External ear normal.  Left Ear: External ear normal.  Mouth/Throat: Oropharynx is clear and moist.  Eyes: Conjunctivae are normal. Pupils are equal, round, and reactive to light. Right eye exhibits no discharge. Left eye exhibits no discharge.  Neck: Neck supple.  No nuchal rigidity  Cardiovascular: Normal rate, regular rhythm and normal heart sounds.  Exam reveals no gallop and no friction rub.   No murmur heard. Pulmonary/Chest: Effort normal and breath sounds normal. No respiratory  distress.  Abdominal: Soft. She exhibits no distension. There is no tenderness.  Musculoskeletal: She exhibits no edema or tenderness.  Neurological: She is alert and oriented to person, place, and time. No cranial nerve deficit or sensory deficit. She exhibits normal muscle tone. Coordination normal.  Skin: Skin is warm and dry.  Psychiatric: She has a normal mood and affect. Her behavior is normal. Thought content normal.  Nursing note and vitals reviewed.    ED Treatments / Results  Labs (all labs ordered are listed, but only abnormal results are displayed) Labs Reviewed - No data to display  EKG  EKG Interpretation None       Radiology No results found.  Procedures Procedures (including critical care time)  Medications Ordered in  ED Medications  acetaminophen (TYLENOL) tablet 1,000 mg (1,000 mg Oral Given 10/21/16 1337)  ibuprofen (ADVIL,MOTRIN) tablet 600 mg (600 mg Oral Given 10/21/16 1439)     Initial Impression / Assessment and Plan / ED Course  I have reviewed the triage vital signs and the nursing notes.  Pertinent labs & imaging results that were available during my care of the patient were reviewed by me and considered in my medical decision making (see chart for details).  Clinical Course     30yF with flu-like symptoms. She is young and otherwise healthy. She is nontoxic. No increased WOB. No hypotension. I doubt serious bacterial illness. PRN NSAIDs. Rest. Fluids. It has been determined that no acute conditions requiring further emergency intervention are present at this time. The patient has been advised of the diagnosis and plan. I reviewed any labs and imaging including any potential incidental findings. We have discussed signs and symptoms that warrant return to the ED and they are listed in the discharge instructions.    Final Clinical Impressions(s) / ED Diagnoses   Final diagnoses:  Flu-like symptoms    New Prescriptions Discharge Medication List as of 10/21/2016  2:34 PM    START taking these medications   Details  oseltamivir (TAMIFLU) 75 MG capsule Take 1 capsule (75 mg total) by mouth every 12 (twelve) hours., Starting Wed 10/21/2016, Print         Raeford RazorStephen Munira Polson, MD 10/23/16 1504

## 2016-10-26 NOTE — L&D Delivery Note (Signed)
Date of delivery: 10/21/2017 Estimated Date of Delivery: 10/23/17 Patient's last menstrual period was 01/16/2017 (exact date). EGA: [redacted]w[redacted]d  Delivery Note At 9:36 AM a viable female was delivered via Vaginal, Spontaneous (Presentation: OA;  LOA).  APGAR: 8, 8; weight 7 lb 6 oz (3345 g).   Placenta status: spontaneous, intact, circumvallate.   Cord:  with the following complications: none.  Cord pH: NA  Called to see patient who arrived to L&D at 8-9 cm with ruptured membranes. Mom pushed to deliver a viable female infant while CNM assisted with reducing cervical lip. The head followed by shoulders, which delivered without difficulty, and the rest of the body.  No nuchal cord noted.  Baby to mom's chest.  Cord clamped and cut after 3 min delay.  Cord blood obtained.  Placenta delivered spontaneously, intact, with a 3-vessel cord.  Very small first degree laceration hemostatic and not repaired.  All counts correct.  Hemostasis obtained with IV pitocin and fundal massage.     Anesthesia: none Episiotomy: None Lacerations: None Suture Repair: NA Est. Blood Loss (mL): 600  Mom to postpartum.  Baby to Couplet care / Skin to Skin.  Tresea Mall, CNM 10/21/2017, 10:31 AM

## 2017-03-17 ENCOUNTER — Ambulatory Visit (INDEPENDENT_AMBULATORY_CARE_PROVIDER_SITE_OTHER): Payer: Medicaid Other | Admitting: Obstetrics and Gynecology

## 2017-03-17 ENCOUNTER — Encounter: Payer: Self-pay | Admitting: Obstetrics and Gynecology

## 2017-03-17 DIAGNOSIS — Z3480 Encounter for supervision of other normal pregnancy, unspecified trimester: Secondary | ICD-10-CM | POA: Diagnosis not present

## 2017-03-17 DIAGNOSIS — Z3A08 8 weeks gestation of pregnancy: Secondary | ICD-10-CM

## 2017-03-17 NOTE — Progress Notes (Signed)
Patient ID: Whitney Whitney [name], female   DOB: 13-Jun-1986, 31 y.o.   MRN: [id number]       New Obstetric Patient H&P    Chief Complaint: "Desires prenatal care"   History of Present Illness: Patient is a 31 y.o. [id number] Not Hispanic or Latino female, LMP 3/11/29/2016 presents with amenorrhea and positive home pregnancy test. Based on her  LMP, her EDD is Estimated Date of Delivery: 10/23/17 and her EGA is [redacted]w[redacted]d. Cycles are regular monthly lasting 4-5 days. Her last pap smear was 3 years ago and was no abnormalities.    She had a urine pregnancy test which was positive 4 week(s)  ago. Since her LMP she claims she has experienced breast tenderness and fatigue. She denies vaginal bleeding. Her past medical history is noncontributory. Her prior pregnancies are notable for none  Since her LMP, she admits to the use of tobacco products  no She claims she has gained   no pounds since the start of her pregnancy.  There are cats in the home in the home  no If yes N/A She admits close contact with children on a regular basis  yes  She has had chicken pox in the past yes She has had Tuberculosis exposures, symptoms, or previously tested positive for TB   no Current or past history of domestic violence. no  Genetic Screening/Teratology Counseling: (Includes patient, baby's father, or anyone in either family with:)   1. Patient's age >/= 89 at [facility]  no 2. Thalassemia ([place], [place], Mediterranean, or Asian background): MCV<80  no 3. Neural tube defect (meningomyelocele, spina bifida, anencephaly)  no 4. Congenital heart defect  no  5. Down syndrome  no 6. Tay-Sachs (Jewish, [place]  no 7. Canavan's Disease  no 8. Sickle cell disease or trait (African)  no  9. Hemophilia or other blood disorders  no  10. Muscular dystrophy  no  11. Cystic fibrosis  no  12. Huntington's Chorea  no  13. Mental retardation/autism  no 14. Other inherited genetic or chromosomal disorder  no 15. Maternal  metabolic disorder (DM, PKU, etc)  no 16. Patient or FOB with a child with a birth defect not listed above no  16a. Patient or FOB with a birth defect themselves no 17. Recurrent pregnancy loss, or stillbirth  no  18. Any medications since LMP other than prenatal vitamins (include vitamins, supplements, OTC meds, drugs, alcohol)  no 19. Any other genetic/environmental exposure to discuss  no  Infection History:   1. Lives with someone with TB or TB exposed  no  2. Patient or partner has history of genital herpes  no 3. Rash or viral illness since LMP  no 4. History of STI (GC, CT, HPV, syphilis, HIV)  no 5. History of recent travel :  no  Other pertinent information:  no     Review of Systems:10 point review of systems negative unless otherwise noted in HPI  Past Medical History:  No past medical history on file.  Past Surgical History:  Past Surgical History:  Procedure Laterality Date  . DILATION AND CURETTAGE OF UTERUS      Gynecologic History: Patient's last menstrual period was 01/16/2017 (exact date).  Obstetric History: [id number]  Family History:  Family History  Problem Relation Age of Onset  . Gout Father   . Hyperlipidemia Father   . Diabetes Other   . Cancer Other     Social History:  Social History   Social History  . Marital status:  Single    Spouse name: N/A  . Number of children: N/A  . Years of education: N/A   Occupational History  . Not on file.   Social History Main Topics  . Smoking status: Current Whitney [name] Day Smoker    Packs/day: 0.75    Types: Cigarettes  . Smokeless tobacco: Never Used  . Alcohol use No     Comment: occasional  . Drug use: No  . Sexual activity: Yes    Birth control/ protection: Injection   Other Topics Concern  . Not on file   Social History Narrative  . No narrative on file    Allergies:  No Known Allergies  Medications: Prior to Admission medications   Medication Sig Start Date End Date Taking?  Authorizing Provider  acetaminophen (TYLENOL) 500 MG tablet Take 1,000 mg by mouth Whitney [name] 6 (six) hours as needed for moderate pain.   Yes [provider]  Prenatal Vit-Fe Fumarate-FA (MULTIVITAMIN-PRENATAL) 27-0.8 MG TABS tablet Take 1 tablet by mouth daily at 12 noon.   Yes [provider]    Physical Exam Vitals: Blood pressure (!) 96/50, weight 133 lb (60.3 kg), last menstrual period 01/16/2017.  General: NAD HEENT: normocephalic, anicteric Thyroid: no enlargement, no palpable nodules Pulmonary: No increased work of breathing, CTAB Cardiovascular: RRR, distal pulses 2+ Abdomen: NABS, soft, non-tender, non-distended.  Umbilicus without lesions.  No hepatomegaly, splenomegaly or masses palpable. No evidence of hernia  Genitourinary:  External: Normal external female genitalia.  Normal urethral meatus, normal  Bartholin's and Skene's glands.    Vagina: Normal vaginal mucosa, no evidence of prolapse.    Cervix: Grossly normal in appearance, no bleeding  Uterus: Non-enlarged, mobile, normal contour.  No CMT  Adnexa: ovaries non-enlarged, no adnexal masses  Rectal: deferred Extremities: no edema, erythema, or tenderness Neurologic: Grossly intact Psychiatric: mood appropriate, affect full   Assessment: 31 y.o. [id number] at [redacted]w[redacted]d presenting to initiate prenatal care  Plan: 1) Avoid alcoholic beverages. 2) Patient encouraged not to smoke.  3) Discontinue the use of all non-medicinal drugs and chemicals.  4) Take prenatal vitamins daily.  5) Nutrition, food safety (fish, cheese advisories, and high nitrite foods) and exercise discussed. 6) Hospital and practice style discussed with cross coverage system.  7) Genetic Screening, such as with 1st Trimester Screening, cell free fetal DNA, AFP testing, and Ultrasound, as well as with amniocentesis and CVS as appropriate, is discussed with patient. At the conclusion of today's visit patient requested genetic testing 8) Patient  is asked about travel to areas at risk for the Zika virus, and counseled to avoid travel and exposure to mosquitoes or sexual partners who may have themselves been exposed to the virus. Testing is discussed, and will be ordered as appropriate.

## 2017-03-17 NOTE — Patient Instructions (Signed)
First Trimester of Pregnancy The first trimester of pregnancy is from week 1 until the end of week 13 (months 1 through 3). A week after a sperm fertilizes an egg, the egg will implant on the wall of the uterus. This embryo will begin to develop into a baby. Genes from you and your partner will form the baby. The female genes will determine whether the baby will be a boy or a girl. At 6-8 weeks, the eyes and face will be formed, and the heartbeat can be seen on ultrasound. At the end of 12 weeks, all the baby's organs will be formed. Now that you are pregnant, you will want to do everything you can to have a healthy baby. Two of the most important things are to get good prenatal care and to follow your health care provider's instructions. Prenatal care is all the medical care you receive before the baby's birth. This care will help prevent, find, and treat any problems during the pregnancy and childbirth. Body changes during your first trimester Your body goes through many changes during pregnancy. The changes vary from woman to woman.  You may gain or lose a couple of pounds at first.  You may feel sick to your stomach (nauseous) and you may throw up (vomit). If the vomiting is uncontrollable, call your health care provider.  You may tire easily.  You may develop headaches that can be relieved by medicines. All medicines should be approved by your health care provider.  You may urinate more often. Painful urination may mean you have a bladder infection.  You may develop heartburn as a result of your pregnancy.  You may develop constipation because certain hormones are causing the muscles that push stool through your intestines to slow down.  You may develop hemorrhoids or swollen veins (varicose veins).  Your breasts may begin to grow larger and become tender. Your nipples may stick out more, and the tissue that surrounds them (areola) may become darker.  Your gums may bleed and may be  sensitive to brushing and flossing.  Dark spots or blotches (chloasma, mask of pregnancy) may develop on your face. This will likely fade after the baby is born.  Your menstrual periods will stop.  You may have a loss of appetite.  You may develop cravings for certain kinds of food.  You may have changes in your emotions from day to day, such as being excited to be pregnant or being concerned that something may go wrong with the pregnancy and baby.  You may have more vivid and strange dreams.  You may have changes in your hair. These can include thickening of your hair, rapid growth, and changes in texture. Some women also have hair loss during or after pregnancy, or hair that feels dry or thin. Your hair will most likely return to normal after your baby is born.  What to expect at prenatal visits During a routine prenatal visit:  You will be weighed to make sure you and the baby are growing normally.  Your blood pressure will be taken.  Your abdomen will be measured to track your baby's growth.  The fetal heartbeat will be listened to between weeks 10 and 14 of your pregnancy.  Test results from any previous visits will be discussed.  Your health care provider may ask you:  How you are feeling.  If you are feeling the baby move.  If you have had any abnormal symptoms, such as leaking fluid, bleeding, severe headaches,   or abdominal cramping.  If you are using any tobacco products, including cigarettes, chewing tobacco, and electronic cigarettes.  If you have any questions.  Other tests that may be performed during your first trimester include:  Blood tests to find your blood type and to check for the presence of any previous infections. The tests will also be used to check for low iron levels (anemia) and protein on red blood cells (Rh antibodies). Depending on your risk factors, or if you previously had diabetes during pregnancy, you may have tests to check for high blood  sugar that affects pregnant women (gestational diabetes).  Urine tests to check for infections, diabetes, or protein in the urine.  An ultrasound to confirm the proper growth and development of the baby.  Fetal screens for spinal cord problems (spina bifida) and Down syndrome.  HIV (human immunodeficiency virus) testing. Routine prenatal testing includes screening for HIV, unless you choose not to have this test.  You may need other tests to make sure you and the baby are doing well.  Follow these instructions at home: Medicines  Follow your health care provider's instructions regarding medicine use. Specific medicines may be either safe or unsafe to take during pregnancy.  Take a prenatal vitamin that contains at least 600 micrograms (mcg) of folic acid.  If you develop constipation, try taking a stool softener if your health care provider approves. Eating and drinking  Eat a balanced diet that includes fresh fruits and vegetables, whole grains, good sources of protein such as meat, eggs, or tofu, and low-fat dairy. Your health care provider will help you determine the amount of weight gain that is right for you.  Avoid raw meat and uncooked cheese. These carry germs that can cause birth defects in the baby.  Eating four or five small meals rather than three large meals a day may help relieve nausea and vomiting. If you start to feel nauseous, eating a few soda crackers can be helpful. Drinking liquids between meals, instead of during meals, also seems to help ease nausea and vomiting.  Limit foods that are high in fat and processed sugars, such as fried and sweet foods.  To prevent constipation: ? Eat foods that are high in fiber, such as fresh fruits and vegetables, whole grains, and beans. ? Drink enough fluid to keep your urine clear or pale yellow. Activity  Exercise only as directed by your health care provider. Most women can continue their usual exercise routine during  pregnancy. Try to exercise for 30 minutes at least 5 days a week. Exercising will help you: ? Control your weight. ? Stay in shape. ? Be prepared for labor and delivery.  Experiencing pain or cramping in the lower abdomen or lower back is a good sign that you should stop exercising. Check with your health care provider before continuing with normal exercises.  Try to avoid standing for long periods of time. Move your legs often if you must stand in one place for a long time.  Avoid heavy lifting.  Wear low-heeled shoes and practice good posture.  You may continue to have sex unless your health care provider tells you not to. Relieving pain and discomfort  Wear a good support bra to relieve breast tenderness.  Take warm sitz baths to soothe any pain or discomfort caused by hemorrhoids. Use hemorrhoid cream if your health care provider approves.  Rest with your legs elevated if you have leg cramps or low back pain.  If you develop   varicose veins in your legs, wear support hose. Elevate your feet for 15 minutes, 3-4 times a day. Limit salt in your diet. Prenatal care  Schedule your prenatal visits by the twelfth week of pregnancy. They are usually scheduled monthly at first, then more often in the last 2 months before delivery.  Write down your questions. Take them to your prenatal visits.  Keep all your prenatal visits as told by your health care provider. This is important. Safety  Wear your seat belt at all times when driving.  Make a list of emergency phone numbers, including numbers for family, friends, the hospital, and police and fire departments. General instructions  Ask your health care provider for a referral to a local prenatal education class. Begin classes no later than the beginning of month 6 of your pregnancy.  Ask for help if you have counseling or nutritional needs during pregnancy. Your health care provider can offer advice or refer you to specialists for help  with various needs.  Do not use hot tubs, steam rooms, or saunas.  Do not douche or use tampons or scented sanitary pads.  Do not cross your legs for long periods of time.  Avoid cat litter boxes and soil used by cats. These carry germs that can cause birth defects in the baby and possibly loss of the fetus by miscarriage or stillbirth.  Avoid all smoking, herbs, alcohol, and medicines not prescribed by your health care provider. Chemicals in these products affect the formation and growth of the baby.  Do not use any products that contain nicotine or tobacco, such as cigarettes and e-cigarettes. If you need help quitting, ask your health care provider. You may receive counseling support and other resources to help you quit.  Schedule a dentist appointment. At home, brush your teeth with a soft toothbrush and be gentle when you floss. Contact a health care provider if:  You have dizziness.  You have mild pelvic cramps, pelvic pressure, or nagging pain in the abdominal area.  You have persistent nausea, vomiting, or diarrhea.  You have a bad smelling vaginal discharge.  You have pain when you urinate.  You notice increased swelling in your face, hands, legs, or ankles.  You are exposed to fifth disease or chickenpox.  You are exposed to German measles (rubella) and have never had it. Get help right away if:  You have a fever.  You are leaking fluid from your vagina.  You have spotting or bleeding from your vagina.  You have severe abdominal cramping or pain.  You have rapid weight gain or loss.  You vomit blood or material that looks like coffee grounds.  You develop a severe headache.  You have shortness of breath.  You have any kind of trauma, such as from a fall or a car accident. Summary  The first trimester of pregnancy is from week 1 until the end of week 13 (months 1 through 3).  Your body goes through many changes during pregnancy. The changes vary from  woman to woman.  You will have routine prenatal visits. During those visits, your health care provider will examine you, discuss any test results you may have, and talk with you about how you are feeling. This information is not intended to replace advice given to you by your health care provider. Make sure you discuss any questions you have with your health care provider. Document Released: 10/06/2001 Document Revised: 09/23/2016 Document Reviewed: 09/23/2016 Elsevier Interactive Patient Education  2017 Elsevier   Inc.  

## 2017-03-17 NOTE — Progress Notes (Signed)
NOB 

## 2017-03-18 LAB — RPR+RH+ABO+RUB AB+AB SCR+CB...
ANTIBODY SCREEN: NEGATIVE
HEMATOCRIT: 38.4 % (ref 34.0–46.6)
HIV SCREEN 4TH GENERATION: NONREACTIVE
Hemoglobin: 13 g/dL (ref 11.1–15.9)
Hepatitis B Surface Ag: NEGATIVE
MCH: 31.9 pg (ref 26.6–33.0)
MCHC: 33.9 g/dL (ref 31.5–35.7)
MCV: 94 fL (ref 79–97)
PLATELETS: 257 10*3/uL (ref 150–379)
RBC: 4.08 x10E6/uL (ref 3.77–5.28)
RDW: 13.3 % (ref 12.3–15.4)
RH TYPE: POSITIVE
RPR Ser Ql: NONREACTIVE
Rubella Antibodies, IGG: 1.7 index (ref >0.99)
Varicella zoster IgG: 261 index (ref >165)
WBC: 10 10*3/uL (ref 3.4–10.8)

## 2017-03-19 LAB — URINE CULTURE: ORGANISM ID, BACTERIA: NO GROWTH

## 2017-03-20 LAB — PAP LB, CT-NG, RFX HPV ASCU
CHLAMYDIA, NUC. ACID AMP: NEGATIVE
Gonococcus, Nuc. Acid Amp: NEGATIVE
PAP SMEAR COMMENT: 0

## 2017-03-31 ENCOUNTER — Ambulatory Visit (INDEPENDENT_AMBULATORY_CARE_PROVIDER_SITE_OTHER): Payer: Medicaid Other | Admitting: Advanced Practice Midwife

## 2017-03-31 ENCOUNTER — Ambulatory Visit (INDEPENDENT_AMBULATORY_CARE_PROVIDER_SITE_OTHER): Payer: Medicaid Other

## 2017-03-31 VITALS — BP 94/60 | Wt 136.0 lb

## 2017-03-31 DIAGNOSIS — Z3A08 8 weeks gestation of pregnancy: Secondary | ICD-10-CM | POA: Diagnosis not present

## 2017-03-31 DIAGNOSIS — Z3682 Encounter for antenatal screening for nuchal translucency: Secondary | ICD-10-CM

## 2017-03-31 DIAGNOSIS — Z362 Encounter for other antenatal screening follow-up: Secondary | ICD-10-CM

## 2017-03-31 DIAGNOSIS — Z1379 Encounter for other screening for genetic and chromosomal anomalies: Secondary | ICD-10-CM

## 2017-03-31 DIAGNOSIS — Z3480 Encounter for supervision of other normal pregnancy, unspecified trimester: Secondary | ICD-10-CM

## 2017-03-31 NOTE — Progress Notes (Signed)
Dating scan today is 7 days difference. Per ACOG dating criteria Committee Opinion # 700 from 2017, EDD would be adjusted if the difference is greater than 7 days. Has had taste of metal in her mouth the last few days. Denies nausea, vomiting, has no metal fillings in her mouth. She is smoking. Encouraged to quit. 1st trimester screen and ROB in 2 weeks

## 2017-03-31 NOTE — Progress Notes (Signed)
Dating scan today.  

## 2017-04-14 ENCOUNTER — Ambulatory Visit (INDEPENDENT_AMBULATORY_CARE_PROVIDER_SITE_OTHER): Payer: Medicaid Other | Admitting: Obstetrics and Gynecology

## 2017-04-14 ENCOUNTER — Other Ambulatory Visit: Payer: Medicaid Other

## 2017-04-14 VITALS — BP 96/50 | Wt 140.0 lb

## 2017-04-14 DIAGNOSIS — Z3A12 12 weeks gestation of pregnancy: Secondary | ICD-10-CM

## 2017-04-14 DIAGNOSIS — Z3480 Encounter for supervision of other normal pregnancy, unspecified trimester: Secondary | ICD-10-CM

## 2017-04-14 NOTE — Progress Notes (Signed)
Some light spotting. Notes a brown and dark discharge. No frank blood or pink discharge. No vaginal sx. Some cramping that resolves with hydration.  Pelvic exam: No obvious lesion, very small amount of brown, mucoid discharge. No active bleeding from cervix.  Cervical os closed. No tenderness on bimanual exam.  Female chaperone present for pelvic exam.  BSUS: SLIUP no SCH noted.  CRL c/w [redacted]w[redacted]d GA, so appropriate growth.  FHR 170 bpm.    Patient reassured. F/u asap with NT u/s.

## 2017-04-16 ENCOUNTER — Ambulatory Visit (INDEPENDENT_AMBULATORY_CARE_PROVIDER_SITE_OTHER): Payer: Medicaid Other

## 2017-04-16 ENCOUNTER — Encounter: Payer: Medicaid Other | Admitting: Obstetrics & Gynecology

## 2017-04-16 DIAGNOSIS — Z3682 Encounter for antenatal screening for nuchal translucency: Secondary | ICD-10-CM | POA: Diagnosis not present

## 2017-04-16 DIAGNOSIS — Z1379 Encounter for other screening for genetic and chromosomal anomalies: Secondary | ICD-10-CM

## 2017-04-21 ENCOUNTER — Ambulatory Visit (INDEPENDENT_AMBULATORY_CARE_PROVIDER_SITE_OTHER): Payer: Medicaid Other | Admitting: Obstetrics & Gynecology

## 2017-04-21 VITALS — BP 100/50 | Wt 139.0 lb

## 2017-04-21 DIAGNOSIS — Z3A13 13 weeks gestation of pregnancy: Secondary | ICD-10-CM

## 2017-04-21 DIAGNOSIS — Z3480 Encounter for supervision of other normal pregnancy, unspecified trimester: Secondary | ICD-10-CM

## 2017-04-21 NOTE — Progress Notes (Signed)
No further bleeding, no pain. [place] Fri, labs today for first screen PNV Plans BTL

## 2017-04-24 LAB — FIRST TRIMESTER SCREEN W/NT
CRL: 56.8 mm
DIA MOM: 2.07
DIA Value: 494.2 pg/mL
Gest Age-Collect: 13 weeks
HCG MOM: 1.3
MATERNAL AGE AT EDD: 30 a
NUCHAL TRANSLUCENCY: 1 mm
NUMBER OF FETUSES: 1
Nuchal Translucency MoM: 0.68
PAPP-A MoM: 0.35
PAPP-A Value: 432.9 ng/mL
Test Results:: NEGATIVE
WEIGHT: 139 [lb_av]
hCG Value: 110.9 IU/mL

## 2017-04-26 NOTE — Progress Notes (Signed)
First screen neg (risk for Downs 1:380).  Unable to leave message.  Discuss next visit.

## 2017-05-19 ENCOUNTER — Ambulatory Visit (INDEPENDENT_AMBULATORY_CARE_PROVIDER_SITE_OTHER): Payer: Medicaid Other | Admitting: Advanced Practice Midwife

## 2017-05-19 VITALS — BP 102/58 | Wt 145.0 lb

## 2017-05-19 DIAGNOSIS — Z3A17 17 weeks gestation of pregnancy: Secondary | ICD-10-CM

## 2017-05-19 DIAGNOSIS — Z3480 Encounter for supervision of other normal pregnancy, unspecified trimester: Secondary | ICD-10-CM

## 2017-05-19 NOTE — Progress Notes (Signed)
ROB

## 2017-05-19 NOTE — Progress Notes (Signed)
No questions or concerns. Uncertain if she has felt fetal movement. She has a good appetite and is staying hydrated. Return in 2-4 weeks for fetal anatomy scan.

## 2017-06-02 ENCOUNTER — Encounter: Payer: Medicaid Other | Admitting: Advanced Practice Midwife

## 2017-06-02 ENCOUNTER — Other Ambulatory Visit: Payer: Medicaid Other

## 2017-06-03 ENCOUNTER — Ambulatory Visit (INDEPENDENT_AMBULATORY_CARE_PROVIDER_SITE_OTHER): Payer: Medicaid Other

## 2017-06-03 ENCOUNTER — Ambulatory Visit (INDEPENDENT_AMBULATORY_CARE_PROVIDER_SITE_OTHER): Payer: Medicaid Other | Admitting: Advanced Practice Midwife

## 2017-06-03 VITALS — BP 104/62 | Wt 151.0 lb

## 2017-06-03 DIAGNOSIS — Z362 Encounter for other antenatal screening follow-up: Secondary | ICD-10-CM

## 2017-06-03 DIAGNOSIS — G43801 Other migraine, not intractable, with status migrainosus: Secondary | ICD-10-CM

## 2017-06-03 DIAGNOSIS — Z3480 Encounter for supervision of other normal pregnancy, unspecified trimester: Secondary | ICD-10-CM

## 2017-06-03 DIAGNOSIS — Z3A19 19 weeks gestation of pregnancy: Secondary | ICD-10-CM

## 2017-06-03 MED ORDER — BUTALBITAL-APAP-CAFFEINE 50-325-40 MG PO CAPS: 1.0000 | ORAL_CAPSULE | Freq: Four times a day (QID) | ORAL | 0 refills | Status: DC | PRN

## 2017-06-03 NOTE — Progress Notes (Signed)
Anatomy scan today. No vb. No lof. Migraines with loss of vision at times.

## 2017-06-03 NOTE — Addendum Note (Signed)
Addended by: Tresea Mall on: 06/03/2017 10:26 AM   Modules accepted: Orders

## 2017-06-03 NOTE — Progress Notes (Signed)
Having occasional migraines with visual changes. Rx sent for Fioricet. Anatomy scan complete today. No Ctx's, LOF, VB.

## 2017-06-08 ENCOUNTER — Telehealth: Payer: Self-pay

## 2017-06-08 NOTE — Telephone Encounter (Signed)
Pt is going to be a walk-in at [facility], [phone], today and wanted to know what antibx they can give her and also wants rx for yeast inf that will come after.  Adv we have dental protocol we can fax and we prefer she use [facility]ist[facility] first.  Dental protocol faxed to [phone].

## 2017-06-20 ENCOUNTER — Encounter (HOSPITAL_COMMUNITY): Payer: Self-pay | Admitting: *Deleted

## 2017-06-20 ENCOUNTER — Emergency Department (HOSPITAL_COMMUNITY)
Admission: EM | Admit: 2017-06-20 | Discharge: 2017-06-20 | Disposition: A | Discharge status: Home / Self Care | Payer: Medicaid Other | Attending: Emergency Medicine | Admitting: Emergency Medicine

## 2017-06-20 DIAGNOSIS — O26892 Other specified pregnancy related conditions, second trimester: Secondary | ICD-10-CM | POA: Insufficient documentation

## 2017-06-20 DIAGNOSIS — F17210 Nicotine dependence, cigarettes, uncomplicated: Secondary | ICD-10-CM | POA: Diagnosis not present

## 2017-06-20 DIAGNOSIS — O99332 Smoking (tobacco) complicating pregnancy, second trimester: Secondary | ICD-10-CM | POA: Insufficient documentation

## 2017-06-20 DIAGNOSIS — Z3A20 20 weeks gestation of pregnancy: Secondary | ICD-10-CM | POA: Insufficient documentation

## 2017-06-20 DIAGNOSIS — K0889 Other specified disorders of teeth and supporting structures: Secondary | ICD-10-CM

## 2017-06-20 MED ORDER — AMOXICILLIN 500 MG PO CAPS: 500.0000 mg | ORAL_CAPSULE | Freq: Two times a day (BID) | ORAL | 0 refills | Status: DC

## 2017-06-20 MED ORDER — HYDROCODONE-ACETAMINOPHEN 5-325 MG PO TABS: 1.0000 | ORAL_TABLET | Freq: Four times a day (QID) | ORAL | 0 refills | Status: DC | PRN

## 2017-06-20 NOTE — ED Triage Notes (Signed)
Pt c/o dental pain, states that she was started on antibiotics by her dentist but missed the last 3 days worth of medicine, thinks that the medication may have been amoxicillin and had to reschedule her follow up dentist appointment for September 4, pt is currently 5 months pregnant,

## 2017-06-20 NOTE — ED Provider Notes (Signed)
[facility]Y DEPT Provider Note   CSN: [id number] Arrival date & time: 06/20/17  0215     History   Chief Complaint Chief Complaint  Patient presents with  . Dental Pain    HPI Whitney [name] is a 31 y.o. female.  The history is provided by the patient.  Dental Pain   This is a new problem. The current episode started more than 2 days ago. The problem occurs daily. The problem has been gradually worsening. The pain is moderate. She has tried rest for the symptoms. The treatment provided no relief.  pt has had dental pain for past several weeks Seen by [facility] Placed on amoxicillin, plan for dental extraction last week but she missed appointment Next appt is in September For past 3 days her pain has worsened because she stopped amoxicillin  She is approximately [redacted] weeks pregnant, no abd pain, no vag bleeding, no complication s  PMH - pregnant - approximately 20 weeks  Patient Active Problem List   Diagnosis Date Noted  . Supervision of other normal pregnancy, antepartum 04/14/2017    Past Surgical History:  Procedure Laterality Date  . DILATION AND CURETTAGE OF UTERUS      OB History    Gravida Para Term Preterm AB Living   4 2 2   1 2    SAB TAB Ectopic Multiple Live Births   1       2       Home Medications    Prior to Admission medications   Medication Sig Start Date End Date Taking? Authorizing Provider  Prenatal Vit-Fe Fumarate-FA (MULTIVITAMIN-PRENATAL) 27-0.8 MG TABS tablet Take 1 tablet by mouth daily at 12 noon.   Yes [provider]  acetaminophen (TYLENOL) 500 MG tablet Take 1,000 mg by mouth every 6 (six) hours as needed for moderate pain.    [provider]  amoxicillin (AMOXIL) 500 MG capsule Take 1 capsule (500 mg total) by mouth 2 (two) times daily. 06/20/17   Zadie Rhine, MD  HYDROcodone-acetaminophen (NORCO/VICODIN) 5-325 MG tablet Take 1 tablet by mouth every 6 (six) hours as needed for severe pain. 06/20/17    Zadie Rhine, MD    Family History Family History  Problem Relation Age of Onset  . Gout Father   . Hyperlipidemia Father   . Diabetes Other   . Cancer Other     Social History Social History  Substance Use Topics  . Smoking status: Current Every Day Smoker    Packs/day: 0.75    Types: Cigarettes  . Smokeless tobacco: Never Used  . Alcohol use No     Comment: occasional     Allergies   Patient has no known allergies.   Review of Systems Review of Systems  Constitutional: Negative for fever.  HENT: Positive for dental problem.   Eyes: Negative for visual disturbance.  Respiratory: Negative for shortness of breath.   Cardiovascular: Negative for chest pain.  Gastrointestinal: Negative for abdominal pain.  Genitourinary: Negative for vaginal bleeding.  All other systems reviewed and are negative.    Physical Exam Updated Vital Signs BP 110/67   Pulse 93   Temp 98.1 F (36.7 C) (Oral)   Resp 16   Ht 1.791 m (5' 10.5")   Wt 68.5 kg (151 lb)   LMP 01/16/2017 (Exact Date)   SpO2 100%   BMI 21.36 kg/m   Physical Exam CONSTITUTIONAL: Well developed/well nourished HEAD: Normocephalic/atraumatic EYES: EOMI/PERRL ENMT: Mucous membranes moist, diffuse tenderness to gingiva of left  upper/lower jaw.  No focal abscess.  No trismus.  No drooling and no stridor NECK: supple no meningeal signs CV: S1/S2 noted, no murmurs/rubs/gallops noted LUNGS: Lungs are clear to auscultation bilaterally, no apparent distress ABDOMEN: soft, nontender, gravid NEURO: Pt is awake/alert/appropriate, moves all extremitiesx4.  No facial droop.   EXTREMITIES: pulses normal/equal, full ROM SKIN: warm, color normal PSYCH: no abnormalities of mood noted, alert and oriented to situation   ED Treatments / Results  Labs (all labs ordered are listed, but only abnormal results are displayed) Labs Reviewed - No data to display  EKG  EKG Interpretation None       Radiology No  results found.  Procedures Procedures (including critical care time)  Medications Ordered in ED Medications - No data to display   Initial Impression / Assessment and Plan / ED Course  I have reviewed the triage vital signs and the nursing notes.  Narcotic database reviewed and considered in decision making   Will give another round of amoxicillin (she is almost out but missed 3 days worth) We discussed risk/benefit of vicodin in pregnancy She accepts these risks and will give short course of vicodin [facility] dental in 48 hrs   Final Clinical Impressions(s) / ED Diagnoses   Final diagnoses:  Pain, dental    New Prescriptions New Prescriptions   AMOXICILLIN (AMOXIL) 500 MG CAPSULE    Take 1 capsule (500 mg total) by mouth 2 (two) times daily.   HYDROCODONE-ACETAMINOPHEN (NORCO/VICODIN) 5-325 MG TABLET    Take 1 tablet by mouth every 6 (six) hours as needed for severe pain.     Zadie Rhine, MD 06/20/17 [phone]

## 2017-07-01 ENCOUNTER — Ambulatory Visit (INDEPENDENT_AMBULATORY_CARE_PROVIDER_SITE_OTHER): Payer: Medicaid Other | Admitting: Obstetrics and Gynecology

## 2017-07-01 VITALS — BP 110/72 | Wt 153.0 lb

## 2017-07-01 DIAGNOSIS — Z131 Encounter for screening for diabetes mellitus: Secondary | ICD-10-CM

## 2017-07-01 DIAGNOSIS — Z3480 Encounter for supervision of other normal pregnancy, unspecified trimester: Secondary | ICD-10-CM

## 2017-07-01 DIAGNOSIS — Z3A23 23 weeks gestation of pregnancy: Secondary | ICD-10-CM

## 2017-07-01 DIAGNOSIS — Z113 Encounter for screening for infections with a predominantly sexual mode of transmission: Secondary | ICD-10-CM

## 2017-07-01 NOTE — Progress Notes (Signed)
  Routine Prenatal Care Visit  Subjective  Whitney [name] is a 31 y.o. [id number] at [redacted]w[redacted]d being seen today for ongoing prenatal care.  She is currently monitored for the following issues for this low-risk pregnancy and has Supervision of other normal pregnancy, antepartum on her problem list.  ----------------------------------------------------------------------------------- Patient reports no bleeding, no contractions and no leaking.   Contractions: Not present. Vag. Bleeding: None.  Movement: Present. Denies leaking of fluid.  Headache improved from last time.  Due to abscessed tooth. Now resolved.  ----------------------------------------------------------------------------------- The following portions of the patient's history were reviewed and updated as appropriate: allergies, current medications, past family history, past medical history, past social history, past surgical history and problem list. Problem list updated.  Objective  Blood pressure 110/72, weight 153 lb (69.4 kg), last menstrual period 01/16/2017. Pregravid weight 136 lb (61.7 kg) Total Weight Gain 17 lb (7.711 kg) Urinalysis: Urine Protein: Negative Urine Glucose: Negative  Fetal Status: Fetal Heart Rate (bpm): 150   Movement: Present     General:  Alert, oriented and cooperative. Patient is in no acute distress.  Skin: Skin is warm and dry. No rash noted.   Cardiovascular: Normal heart rate noted  Respiratory: Normal respiratory effort, no problems with respiration noted  Abdomen: Soft, gravid, appropriate for gestational age. Pain/Pressure: Absent     Pelvic:  Cervical exam deferred        Extremities: Normal range of motion.     Mental Status: Normal mood and affect. Normal behavior. Normal judgment and thought content.   Assessment   30 y.o. [id number] at [redacted]w[redacted]d by  10/23/2017, by Last Menstrual Period presenting for routine prenatal visit  Plan   pregnancy #4 Problems (from 01/16/17 to present)    Problem  Noted Resolved   Supervision of other normal pregnancy, antepartum 04/14/2017 by Conard Novak, MD No   Overview Signed 04/14/2017  3:28 PM by Conard Novak, MD    [facility]inic [facility] Prenatal Labs  Dating L=9 Blood type: O/Positive/-- (05/23 1551)   Genetic Screen 1 Screen:    AFP:     Quad:     NIPS: Antibody:Negative (05/23 1551)  Anatomic [place]  Rubella: 1.70 (05/23 1551)  Varicella: Immune  GTT Early:               Third trimester:  RPR: Non Reactive (05/23 1551)   Rhogam  HBsAg: Negative (05/23 1551)   TDaP vaccine                       Flu Shot: HIV: Non Reactive (05/23 1551)   Baby Food                                GBS:   Contraception  Pap:  CBB     CS/VBAC    Support Person                 Preterm labor symptoms and general obstetric precautions including but not limited to vaginal bleeding, contractions, leaking of fluid and fetal movement were reviewed in detail with the patient. Please refer to After Visit Summary for other counseling recommendations.   Return in about 4 weeks (around 07/29/2017) for schedule 1 hour gtt and routine OB.  Thomasene Mohair, MD  07/01/2017 11:22 AM

## 2017-07-29 ENCOUNTER — Ambulatory Visit (INDEPENDENT_AMBULATORY_CARE_PROVIDER_SITE_OTHER): Payer: Medicaid Other | Admitting: Advanced Practice Midwife

## 2017-07-29 ENCOUNTER — Other Ambulatory Visit: Payer: Medicaid Other

## 2017-07-29 VITALS — BP 118/74 | Wt 157.0 lb

## 2017-07-29 DIAGNOSIS — Z3480 Encounter for supervision of other normal pregnancy, unspecified trimester: Secondary | ICD-10-CM

## 2017-07-29 DIAGNOSIS — Z1379 Encounter for other screening for genetic and chromosomal anomalies: Secondary | ICD-10-CM

## 2017-07-29 DIAGNOSIS — Z3A27 27 weeks gestation of pregnancy: Secondary | ICD-10-CM

## 2017-07-29 DIAGNOSIS — Z131 Encounter for screening for diabetes mellitus: Secondary | ICD-10-CM

## 2017-07-29 DIAGNOSIS — Z113 Encounter for screening for infections with a predominantly sexual mode of transmission: Secondary | ICD-10-CM

## 2017-07-29 NOTE — Patient Instructions (Signed)
Third Trimester of Pregnancy The third trimester is from week 28 through week 40 (months 7 through 9). The third trimester is a time when the unborn baby (fetus) is growing rapidly. At the end of the ninth month, the fetus is about 20 inches in length and weighs 6-10 pounds. Body changes during your third trimester Your body will continue to go through many changes during pregnancy. The changes vary from woman to woman. During the third trimester:  Your weight will continue to increase. You can expect to gain 25-35 pounds (11-16 kg) by the end of the pregnancy.  You may begin to get stretch marks on your hips, abdomen, and breasts.  You may urinate more often because the fetus is moving lower into your pelvis and pressing on your bladder.  You may develop or continue to have heartburn. This is caused by increased hormones that slow down muscles in the digestive tract.  You may develop or continue to have constipation because increased hormones slow digestion and cause the muscles that push waste through your intestines to relax.  You may develop hemorrhoids. These are swollen veins (varicose veins) in the rectum that can itch or be painful.  You may develop swollen, bulging veins (varicose veins) in your legs.  You may have increased body aches in the pelvis, back, or thighs. This is due to weight gain and increased hormones that are relaxing your joints.  You may have changes in your hair. These can include thickening of your hair, rapid growth, and changes in texture. Some women also have hair loss during or after pregnancy, or hair that feels dry or thin. Your hair will most likely return to normal after your baby is born.  Your breasts will continue to grow and they will continue to become tender. A yellow fluid (colostrum) may leak from your breasts. This is the first milk you are producing for your baby.  Your belly button may stick out.  You may notice more swelling in your hands,  face, or ankles.  You may have increased tingling or numbness in your hands, arms, and legs. The skin on your belly may also feel numb.  You may feel short of breath because of your expanding uterus.  You may have more problems sleeping. This can be caused by the size of your belly, increased need to urinate, and an increase in your body's metabolism.  You may notice the fetus "dropping," or moving lower in your abdomen (lightening).  You may have increased vaginal discharge.  You may notice your joints feel loose and you may have pain around your pelvic bone.  What to expect at prenatal visits You will have prenatal exams every 2 weeks until week 36. Then you will have weekly prenatal exams. During a routine prenatal visit:  You will be weighed to make sure you and the baby are growing normally.  Your blood pressure will be taken.  Your abdomen will be measured to track your baby's growth.  The fetal heartbeat will be listened to.  Any test results from the previous visit will be discussed.  You may have a cervical check near your due date to see if your cervix has softened or thinned (effaced).  You will be tested for Group B streptococcus. This happens between 35 and 37 weeks.  Your health care provider may ask you:  What your birth plan is.  How you are feeling.  If you are feeling the baby move.  If you have had   any abnormal symptoms, such as leaking fluid, bleeding, severe headaches, or abdominal cramping.  If you are using any tobacco products, including cigarettes, chewing tobacco, and electronic cigarettes.  If you have any questions.  Other tests or screenings that may be performed during your third trimester include:  Blood tests that check for low iron levels (anemia).  Fetal testing to check the health, activity level, and growth of the fetus. Testing is done if you have certain medical conditions or if there are problems during the  pregnancy.  Nonstress test (NST). This test checks the health of your baby to make sure there are no signs of problems, such as the baby not getting enough oxygen. During this test, a belt is placed around your belly. The baby is made to move, and its heart rate is monitored during movement.  What is false labor? False labor is a condition in which you feel small, irregular tightenings of the muscles in the womb (contractions) that usually go away with rest, changing position, or drinking water. These are called Braxton Hicks contractions. Contractions may last for hours, days, or even weeks before true labor sets in. If contractions come at regular intervals, become more frequent, increase in intensity, or become painful, you should see your health care provider. What are the signs of labor?  Abdominal cramps.  Regular contractions that start at 10 minutes apart and become stronger and more frequent with time.  Contractions that start on the top of the uterus and spread down to the lower abdomen and back.  Increased pelvic pressure and dull back pain.  A watery or bloody mucus discharge that comes from the vagina.  Leaking of amniotic fluid. This is also known as your "water breaking." It could be a slow trickle or a gush. Let your health care provider know if it has a color or strange odor. If you have any of these signs, call your health care provider right away, even if it is before your due date. Follow these instructions at home: Medicines  Follow your health care provider's instructions regarding medicine use. Specific medicines may be either safe or unsafe to take during pregnancy.  Take a prenatal vitamin that contains at least 600 micrograms (mcg) of folic acid.  If you develop constipation, try taking a stool softener if your health care provider approves. Eating and drinking  Eat a balanced diet that includes fresh fruits and vegetables, whole grains, good sources of protein  such as meat, eggs, or tofu, and low-fat dairy. Your health care provider will help you determine the amount of weight gain that is right for you.  Avoid raw meat and uncooked cheese. These carry germs that can cause birth defects in the baby.  If you have low calcium intake from food, talk to your health care provider about whether you should take a daily calcium supplement.  Eat four or five small meals rather than three large meals a day.  Limit foods that are high in fat and processed sugars, such as fried and sweet foods.  To prevent constipation: ? Drink enough fluid to keep your urine clear or pale yellow. ? Eat foods that are high in fiber, such as fresh fruits and vegetables, whole grains, and beans. Activity  Exercise only as directed by your health care provider. Most women can continue their usual exercise routine during pregnancy. Try to exercise for 30 minutes at least 5 days a week. Stop exercising if you experience uterine contractions.  Avoid heavy   lifting.  Do not exercise in extreme heat or humidity, or at high altitudes.  Wear low-heel, comfortable shoes.  Practice good posture.  You may continue to have sex unless your health care provider tells you otherwise. Relieving pain and discomfort  Take frequent breaks and rest with your legs elevated if you have leg cramps or low back pain.  Take warm sitz baths to soothe any pain or discomfort caused by hemorrhoids. Use hemorrhoid cream if your health care provider approves.  Wear a good support bra to prevent discomfort from breast tenderness.  If you develop varicose veins: ? Wear support pantyhose or compression stockings as told by your healthcare provider. ? Elevate your feet for 15 minutes, 3-4 times a day. Prenatal care  Write down your questions. Take them to your prenatal visits.  Keep all your prenatal visits as told by your health care provider. This is important. Safety  Wear your seat belt at  all times when driving.  Make a list of emergency phone numbers, including numbers for family, friends, the hospital, and police and fire departments. General instructions  Avoid cat litter boxes and soil used by cats. These carry germs that can cause birth defects in the baby. If you have a cat, ask someone to clean the litter box for you.  Do not travel far distances unless it is absolutely necessary and only with the approval of your health care provider.  Do not use hot tubs, steam rooms, or saunas.  Do not drink alcohol.  Do not use any products that contain nicotine or tobacco, such as cigarettes and e-cigarettes. If you need help quitting, ask your health care provider.  Do not use any medicinal herbs or unprescribed drugs. These chemicals affect the formation and growth of the baby.  Do not douche or use tampons or scented sanitary pads.  Do not cross your legs for long periods of time.  To prepare for the arrival of your baby: ? Take prenatal classes to understand, practice, and ask questions about labor and delivery. ? Make a trial run to the hospital. ? Visit the hospital and tour the maternity area. ? Arrange for maternity or paternity leave through employers. ? Arrange for family and friends to take care of pets while you are in the hospital. ? Purchase a rear-facing car seat and make sure you know how to install it in your car. ? Pack your hospital bag. ? Prepare the baby's nursery. Make sure to remove all pillows and stuffed animals from the baby's crib to prevent suffocation.  Visit your dentist if you have not gone during your pregnancy. Use a soft toothbrush to brush your teeth and be gentle when you floss. Contact a health care provider if:  You are unsure if you are in labor or if your water has broken.  You become dizzy.  You have mild pelvic cramps, pelvic pressure, or nagging pain in your abdominal area.  You have lower back pain.  You have persistent  nausea, vomiting, or diarrhea.  You have an unusual or bad smelling vaginal discharge.  You have pain when you urinate. Get help right away if:  Your water breaks before 37 weeks.  You have regular contractions less than 5 minutes apart before 37 weeks.  You have a fever.  You are leaking fluid from your vagina.  You have spotting or bleeding from your vagina.  You have severe abdominal pain or cramping.  You have rapid weight loss or weight gain.    You have shortness of breath with chest pain.  You notice sudden or extreme swelling of your face, hands, ankles, feet, or legs.  Your baby makes fewer than 10 movements in 2 hours.  You have severe headaches that do not go away when you take medicine.  You have vision changes. Summary  The third trimester is from week 28 through week 40, months 7 through 9. The third trimester is a time when the unborn baby (fetus) is growing rapidly.  During the third trimester, your discomfort may increase as you and your baby continue to gain weight. You may have abdominal, leg, and back pain, sleeping problems, and an increased need to urinate.  During the third trimester your breasts will keep growing and they will continue to become tender. A yellow fluid (colostrum) may leak from your breasts. This is the first milk you are producing for your baby.  False labor is a condition in which you feel small, irregular tightenings of the muscles in the womb (contractions) that eventually go away. These are called Braxton Hicks contractions. Contractions may last for hours, days, or even weeks before true labor sets in.  Signs of labor can include: abdominal cramps; regular contractions that start at 10 minutes apart and become stronger and more frequent with time; watery or bloody mucus discharge that comes from the vagina; increased pelvic pressure and dull back pain; and leaking of amniotic fluid. This information is not intended to replace advice  given to you by your health care provider. Make sure you discuss any questions you have with your health care provider. Document Released: 10/06/2001 Document Revised: 03/19/2016 Document Reviewed: 12/13/2012 Elsevier Interactive Patient Education  2017 [employer]  

## 2017-07-29 NOTE — Progress Notes (Signed)
28 week labs today. Has c/o feeling lightheaded and occasionally dizzy usually in the mornings. Recommend increase protein intake, maintain adequate hydration, try OTC Sudafed if she feels like there is congestion/vertigo. Smoking cessation encouraged. Good fetal movement. Having some BH ctx's with occasional cramping. The cramping goes away when she hydrates. No LOF or VB.

## 2017-07-29 NOTE — Progress Notes (Signed)
No vb. No lof.  

## 2017-07-30 LAB — 28 WEEK RH+PANEL
BASOS: 0 %
Basophils Absolute: 0.1 10*3/uL (ref 0.0–0.2)
EOS (ABSOLUTE): 0.2 10*3/uL (ref 0.0–0.4)
Eos: 1 %
Gestational Diabetes Screen: 78 mg/dL (ref 65–139)
HEMOGLOBIN: 11.5 g/dL (ref 11.1–15.9)
HIV SCREEN 4TH GENERATION: NONREACTIVE
Hematocrit: 34.2 % (ref 34.0–46.6)
IMMATURE GRANS (ABS): 0.3 10*3/uL — AB (ref 0.0–0.1)
IMMATURE GRANULOCYTES: 2 %
LYMPHS: 16 %
Lymphocytes Absolute: 2.4 10*3/uL (ref 0.7–3.1)
MCH: 33.5 pg — ABNORMAL HIGH (ref 26.6–33.0)
MCHC: 33.6 g/dL (ref 31.5–35.7)
MCV: 100 fL — ABNORMAL HIGH (ref 79–97)
MONOCYTES: 8 %
Monocytes Absolute: 1.1 10*3/uL — ABNORMAL HIGH (ref 0.1–0.9)
NEUTROS ABS: 11 10*3/uL — AB (ref 1.4–7.0)
NEUTROS PCT: 73 %
Platelets: 280 10*3/uL (ref 150–379)
RBC: 3.43 x10E6/uL — AB (ref 3.77–5.28)
RDW: 13.7 % (ref 12.3–15.4)
RPR Ser Ql: NONREACTIVE
WBC: 15 10*3/uL — ABNORMAL HIGH (ref 3.4–10.8)

## 2017-08-12 ENCOUNTER — Ambulatory Visit (INDEPENDENT_AMBULATORY_CARE_PROVIDER_SITE_OTHER): Payer: Medicaid Other | Admitting: Obstetrics and Gynecology

## 2017-08-12 VITALS — BP 124/74 | Wt 158.0 lb

## 2017-08-12 DIAGNOSIS — Z3A29 29 weeks gestation of pregnancy: Secondary | ICD-10-CM

## 2017-08-12 DIAGNOSIS — Z3480 Encounter for supervision of other normal pregnancy, unspecified trimester: Secondary | ICD-10-CM

## 2017-08-12 NOTE — Progress Notes (Signed)
  Routine Prenatal Care Visit  Subjective  Whitney [name] is a 31 y.o. [id number] at [redacted]w[redacted]d being seen today for ongoing prenatal care.  She is currently monitored for the following issues for this low-risk pregnancy and has Supervision of other normal pregnancy, antepartum on her problem list.  ----------------------------------------------------------------------------------- Patient reports no complaints.   Contractions: Not present. Vag. Bleeding: None.  Movement: Present. Denies leaking of fluid.  Declines flu/TDaP Vaccines today. Will get at next visit.  ----------------------------------------------------------------------------------- The following portions of the patient's history were reviewed and updated as appropriate: allergies, current medications, past family history, past medical history, past social history, past surgical history and problem list. Problem list updated. Objective  Blood pressure 124/74, weight 158 lb (71.7 kg), last menstrual period 01/16/2017. Pregravid weight 136 lb (61.7 kg) Total Weight Gain 22 lb (9.979 kg) Urinalysis: Urine Protein: Negative Urine Glucose: Negative  Fetal Status: Fetal Heart Rate (bpm): 145 Fundal Height: 29 cm Movement: Present     General:  Alert, oriented and cooperative. Patient is in no acute distress.  Skin: Skin is warm and dry. No rash noted.   Cardiovascular: Normal heart rate noted  Respiratory: Normal respiratory effort, no problems with respiration noted  Abdomen: Soft, gravid, appropriate for gestational age. Pain/Pressure: Absent     Pelvic:  Cervical exam deferred        Extremities: Normal range of motion.     Mental Status: Normal mood and affect. Normal behavior. Normal judgment and thought content.   Assessment   31 y.o. [id number] at [redacted]w[redacted]d by  10/23/2017, by Last Menstrual Period presenting for routine prenatal visit  Plan   pregnancy #4 Problems (from 01/16/17 to present)    Problem Noted Resolved   Supervision  of other normal pregnancy, antepartum 04/14/2017 by Conard Novak, MD No   Overview Addendum 07/30/2017  8:00 AM by Conard Novak, MD    [facility] Prenatal Labs  Dating L=9 Blood type: O/Positive/-- (05/23 1551)   Genetic Screen 1 Screen:    AFP:     Quad:     NIPS: Antibody:Negative (05/23 1551)  Anatomic [place]  Rubella: 1.70 (05/23 1551)  Varicella: Immune  GTT Third trimester: 78 RPR: Non Reactive (05/23 1551)   Rhogam  HBsAg: Negative (05/23 1551)   TDaP vaccine                       Flu Shot: HIV: Non Reactive (05/23 1551)   Baby Food                                GBS:   Contraception  Pap:  CBB     CS/VBAC    Support Person            Preterm labor symptoms and general obstetric precautions including but not limited to vaginal bleeding, contractions, leaking of fluid and fetal movement were reviewed in detail with the patient. Please refer to After Visit Summary for other counseling recommendations.   Return in about 2 weeks (around 08/26/2017) for Routine Prenatal Appointment.  Thomasene Mohair, MD  08/12/2017 11:38 AM

## 2017-08-26 ENCOUNTER — Encounter: Payer: Medicaid Other | Admitting: Certified Nurse Midwife

## 2017-08-27 ENCOUNTER — Encounter: Payer: Medicaid Other | Admitting: Obstetrics and Gynecology

## 2017-08-31 ENCOUNTER — Ambulatory Visit (INDEPENDENT_AMBULATORY_CARE_PROVIDER_SITE_OTHER): Payer: Medicaid Other | Admitting: Maternal Newborn

## 2017-08-31 VITALS — BP 90/60 | Wt 163.0 lb

## 2017-08-31 DIAGNOSIS — Z3480 Encounter for supervision of other normal pregnancy, unspecified trimester: Secondary | ICD-10-CM

## 2017-08-31 DIAGNOSIS — Z331 Pregnant state, incidental: Secondary | ICD-10-CM | POA: Diagnosis not present

## 2017-08-31 DIAGNOSIS — Z3A32 32 weeks gestation of pregnancy: Secondary | ICD-10-CM

## 2017-08-31 DIAGNOSIS — Z23 Encounter for immunization: Secondary | ICD-10-CM | POA: Diagnosis not present

## 2017-08-31 MED ORDER — TETANUS-DIPHTH-ACELL PERTUSSIS 5-2.5-18.5 LF-MCG/0.5 IM SUSP
0.5000 mL | Freq: Once | INTRAMUSCULAR | Status: AC
  Administered 2017-08-31: 0.5 mL via INTRAMUSCULAR

## 2017-08-31 NOTE — Progress Notes (Signed)
    Routine Prenatal Care Visit  Subjective  Whitney [name] is a 31 y.o. [id number] at [redacted]w[redacted]d being seen today for ongoing prenatal care.  She is currently monitored for the following issues for this low-risk pregnancy and has Supervision of other normal pregnancy, antepartum on their problem list.  ----------------------------------------------------------------------------------- Patient reports ringworm on her hand, using OTC topical antifungal approved by pharmacist. To be evaluated by dentist for tooth abscess. Otherwise, no complaints. Contractions: Not present. Vag. Bleeding: None.  Movement: Present. Denies leaking of fluid.  ----------------------------------------------------------------------------------- The following portions of the patient's history were reviewed and updated as appropriate: allergies, current medications, past family history, past medical history, past social history, past surgical history and problem list. Problem list updated.   Objective  Last menstrual period 01/16/2017. Pregravid weight 136 lb (61.7 kg) Total Weight Gain 27 lb (12.2 kg) Urinalysis: Urine Protein: Negative Urine Glucose: Negative  Fetal Status: Fetal Heart Rate (bpm): 140 Fundal Height: 30 cm Movement: Present     General:  Alert, oriented and cooperative. Patient is in no acute distress.  Skin: Skin is warm and dry. No rash noted.   Cardiovascular: Normal heart rate noted  Respiratory: Normal respiratory effort, no problems with respiration noted  Abdomen: Soft, gravid, appropriate for gestational age. Pain/Pressure: Absent     Pelvic:  Cervical exam deferred        Extremities: Normal range of motion.  Edema: None  Mental Status: Normal mood and affect. Normal behavior. Normal judgment and thought content.     Assessment   31 y.o. [id number] at [redacted]w[redacted]d by  10/23/2017, by Last Menstrual Period presenting for routine prenatal visit.  Plan   pregnancy #4 Problems (from 01/16/17 to  present)    Problem Noted Resolved   Supervision of other normal pregnancy, antepartum 04/14/2017 by Conard Novak, MD No   Overview Addendum 07/30/2017  8:00 AM by Conard Novak, MD    [facility] Prenatal Labs  Dating L=9 Blood type: O/Positive/-- (05/23 1551)   Genetic Screen 1 Screen:    AFP:     Quad:     NIPS: Antibody:Negative (05/23 1551)  Anatomic [place]  Rubella: 1.70 (05/23 1551)  Varicella: Immune  GTT Third trimester: 78 RPR: Non Reactive (05/23 1551)   Rhogam  HBsAg: Negative (05/23 1551)   TDaP vaccine                       Flu Shot: HIV: Non Reactive (05/23 1551)   Baby Food                                GBS:   Contraception  Pap:  CBB     CS/VBAC    Support Person                Patient received TdaP and flu vaccines today.  Preterm labor symptoms and general obstetric precautions including but not limited to vaginal bleeding, contractions, leaking of fluid and fetal movement were reviewed in detail with the patient. Please refer to After Visit Summary for other counseling recommendations.   Return in about 2 weeks (around 09/14/2017) for ROB.  Marcelyn Bruins, CNM 08/31/2017  12:59 PM

## 2017-08-31 NOTE — Patient Instructions (Signed)
Third Trimester of Pregnancy The third trimester is from week 28 through week 40 (months 7 through 9). The third trimester is a time when the unborn baby (fetus) is growing rapidly. At the end of the ninth month, the fetus is about 20 inches in length and weighs 6-10 pounds. Body changes during your third trimester Your body will continue to go through many changes during pregnancy. The changes vary from woman to woman. During the third trimester:  Your weight will continue to increase. You can expect to gain 25-35 pounds (11-16 kg) by the end of the pregnancy.  You may begin to get stretch marks on your hips, abdomen, and breasts.  You may urinate more often because the fetus is moving lower into your pelvis and pressing on your bladder.  You may develop or continue to have heartburn. This is caused by increased hormones that slow down muscles in the digestive tract.  You may develop or continue to have constipation because increased hormones slow digestion and cause the muscles that push waste through your intestines to relax.  You may develop hemorrhoids. These are swollen veins (varicose veins) in the rectum that can itch or be painful.  You may develop swollen, bulging veins (varicose veins) in your legs.  You may have increased body aches in the pelvis, back, or thighs. This is due to weight gain and increased hormones that are relaxing your joints.  You may have changes in your hair. These can include thickening of your hair, rapid growth, and changes in texture. Some women also have hair loss during or after pregnancy, or hair that feels dry or thin. Your hair will most likely return to normal after your baby is born.  Your breasts will continue to grow and they will continue to become tender. A yellow fluid (colostrum) may leak from your breasts. This is the first milk you are producing for your baby.  Your belly button may stick out.  You may notice more swelling in your hands,  face, or ankles.  You may have increased tingling or numbness in your hands, arms, and legs. The skin on your belly may also feel numb.  You may feel short of breath because of your expanding uterus.  You may have more problems sleeping. This can be caused by the size of your belly, increased need to urinate, and an increase in your body's metabolism.  You may notice the fetus "dropping," or moving lower in your abdomen (lightening).  You may have increased vaginal discharge.  You may notice your joints feel loose and you may have pain around your pelvic bone.  What to expect at prenatal visits You will have prenatal exams every 2 weeks until week 36. Then you will have weekly prenatal exams. During a routine prenatal visit:  You will be weighed to make sure you and the baby are growing normally.  Your blood pressure will be taken.  Your abdomen will be measured to track your baby's growth.  The fetal heartbeat will be listened to.  Any test results from the previous visit will be discussed.  You may have a cervical check near your due date to see if your cervix has softened or thinned (effaced).  You will be tested for Group B streptococcus. This happens between 35 and 37 weeks.  Your health care provider may ask you:  What your birth plan is.  How you are feeling.  If you are feeling the baby move.  If you have had   any abnormal symptoms, such as leaking fluid, bleeding, severe headaches, or abdominal cramping.  If you are using any tobacco products, including cigarettes, chewing tobacco, and electronic cigarettes.  If you have any questions.  Other tests or screenings that may be performed during your third trimester include:  Blood tests that check for low iron levels (anemia).  Fetal testing to check the health, activity level, and growth of the fetus. Testing is done if you have certain medical conditions or if there are problems during the  pregnancy.  Nonstress test (NST). This test checks the health of your baby to make sure there are no signs of problems, such as the baby not getting enough oxygen. During this test, a belt is placed around your belly. The baby is made to move, and its heart rate is monitored during movement.  What is false labor? False labor is a condition in which you feel small, irregular tightenings of the muscles in the womb (contractions) that usually go away with rest, changing position, or drinking water. These are called Braxton Hicks contractions. Contractions may last for hours, days, or even weeks before true labor sets in. If contractions come at regular intervals, become more frequent, increase in intensity, or become painful, you should see your health care provider. What are the signs of labor?  Abdominal cramps.  Regular contractions that start at 10 minutes apart and become stronger and more frequent with time.  Contractions that start on the top of the uterus and spread down to the lower abdomen and back.  Increased pelvic pressure and dull back pain.  A watery or bloody mucus discharge that comes from the vagina.  Leaking of amniotic fluid. This is also known as your "water breaking." It could be a slow trickle or a gush. Let your health care provider know if it has a color or strange odor. If you have any of these signs, call your health care provider right away, even if it is before your due date. Follow these instructions at home: Medicines  Follow your health care provider's instructions regarding medicine use. Specific medicines may be either safe or unsafe to take during pregnancy.  Take a prenatal vitamin that contains at least 600 micrograms (mcg) of folic acid.  If you develop constipation, try taking a stool softener if your health care provider approves. Eating and drinking  Eat a balanced diet that includes fresh fruits and vegetables, whole grains, good sources of protein  such as meat, eggs, or tofu, and low-fat dairy. Your health care provider will help you determine the amount of weight gain that is right for you.  Avoid raw meat and uncooked cheese. These carry germs that can cause birth defects in the baby.  If you have low calcium intake from food, talk to your health care provider about whether you should take a daily calcium supplement.  Eat four or five small meals rather than three large meals a day.  Limit foods that are high in fat and processed sugars, such as fried and sweet foods.  To prevent constipation: ? Drink enough fluid to keep your urine clear or pale yellow. ? Eat foods that are high in fiber, such as fresh fruits and vegetables, whole grains, and beans. Activity  Exercise only as directed by your health care provider. Most women can continue their usual exercise routine during pregnancy. Try to exercise for 30 minutes at least 5 days a week. Stop exercising if you experience uterine contractions.  Avoid heavy   lifting.  Do not exercise in extreme heat or humidity, or at high altitudes.  Wear low-heel, comfortable shoes.  Practice good posture.  You may continue to have sex unless your health care provider tells you otherwise. Relieving pain and discomfort  Take frequent breaks and rest with your legs elevated if you have leg cramps or low back pain.  Take warm sitz baths to soothe any pain or discomfort caused by hemorrhoids. Use hemorrhoid cream if your health care provider approves.  Wear a good support bra to prevent discomfort from breast tenderness.  If you develop varicose veins: ? Wear support pantyhose or compression stockings as told by your healthcare provider. ? Elevate your feet for 15 minutes, 3-4 times a day. Prenatal care  Write down your questions. Take them to your prenatal visits.  Keep all your prenatal visits as told by your health care provider. This is important. Safety  Wear your seat belt at  all times when driving.  Make a list of emergency phone numbers, including numbers for family, friends, the hospital, and police and fire departments. General instructions  Avoid cat litter boxes and soil used by cats. These carry germs that can cause birth defects in the baby. If you have a cat, ask someone to clean the litter box for you.  Do not travel far distances unless it is absolutely necessary and only with the approval of your health care provider.  Do not use hot tubs, steam rooms, or saunas.  Do not drink alcohol.  Do not use any products that contain nicotine or tobacco, such as cigarettes and e-cigarettes. If you need help quitting, ask your health care provider.  Do not use any medicinal herbs or unprescribed drugs. These chemicals affect the formation and growth of the baby.  Do not douche or use tampons or scented sanitary pads.  Do not cross your legs for long periods of time.  To prepare for the arrival of your baby: ? Take prenatal classes to understand, practice, and ask questions about labor and delivery. ? Make a trial run to the hospital. ? Visit the hospital and tour the maternity area. ? Arrange for maternity or paternity leave through employers. ? Arrange for family and friends to take care of pets while you are in the hospital. ? Purchase a rear-facing car seat and make sure you know how to install it in your car. ? Pack your hospital bag. ? Prepare the baby's nursery. Make sure to remove all pillows and stuffed animals from the baby's crib to prevent suffocation.  Visit your dentist if you have not gone during your pregnancy. Use a soft toothbrush to brush your teeth and be gentle when you floss. Contact a health care provider if:  You are unsure if you are in labor or if your water has broken.  You become dizzy.  You have mild pelvic cramps, pelvic pressure, or nagging pain in your abdominal area.  You have lower back pain.  You have persistent  nausea, vomiting, or diarrhea.  You have an unusual or bad smelling vaginal discharge.  You have pain when you urinate. Get help right away if:  Your water breaks before 37 weeks.  You have regular contractions less than 5 minutes apart before 37 weeks.  You have a fever.  You are leaking fluid from your vagina.  You have spotting or bleeding from your vagina.  You have severe abdominal pain or cramping.  You have rapid weight loss or weight gain.    You have shortness of breath with chest pain.  You notice sudden or extreme swelling of your face, hands, ankles, feet, or legs.  Your baby makes fewer than 10 movements in 2 hours.  You have severe headaches that do not go away when you take medicine.  You have vision changes. Summary  The third trimester is from week 28 through week 40, months 7 through 9. The third trimester is a time when the unborn baby (fetus) is growing rapidly.  During the third trimester, your discomfort may increase as you and your baby continue to gain weight. You may have abdominal, leg, and back pain, sleeping problems, and an increased need to urinate.  During the third trimester your breasts will keep growing and they will continue to become tender. A yellow fluid (colostrum) may leak from your breasts. This is the first milk you are producing for your baby.  False labor is a condition in which you feel small, irregular tightenings of the muscles in the womb (contractions) that eventually go away. These are called Braxton Hicks contractions. Contractions may last for hours, days, or even weeks before true labor sets in.  Signs of labor can include: abdominal cramps; regular contractions that start at 10 minutes apart and become stronger and more frequent with time; watery or bloody mucus discharge that comes from the vagina; increased pelvic pressure and dull back pain; and leaking of amniotic fluid. This information is not intended to replace advice  given to you by your health care provider. Make sure you discuss any questions you have with your health care provider. Document Released: 10/06/2001 Document Revised: 03/19/2016 Document Reviewed: 12/13/2012 Elsevier Interactive Patient Education  2017 [employer]  

## 2017-08-31 NOTE — Progress Notes (Signed)
C/o absessed tooth.  Flu/tdap given today.  Blood tx signed.rj

## 2017-09-14 ENCOUNTER — Encounter: Payer: Self-pay | Admitting: Certified Nurse Midwife

## 2017-09-14 ENCOUNTER — Ambulatory Visit (INDEPENDENT_AMBULATORY_CARE_PROVIDER_SITE_OTHER): Payer: Medicaid Other | Admitting: Certified Nurse Midwife

## 2017-09-14 VITALS — BP 92/58 | Wt 163.0 lb

## 2017-09-14 DIAGNOSIS — Z3A34 34 weeks gestation of pregnancy: Secondary | ICD-10-CM

## 2017-09-14 DIAGNOSIS — Z3480 Encounter for supervision of other normal pregnancy, unspecified trimester: Secondary | ICD-10-CM

## 2017-09-14 NOTE — Progress Notes (Signed)
Pt c/o pain in upper inner thighs/joint area.

## 2017-09-14 NOTE — Progress Notes (Signed)
ROB at 34wk3d. BAby moving well Having some pelvic type pain in saddle area No vaginal bleeding Desires ppBTL-discussed effectiveness, failure rate, as well as risks of bleeding, infection, injury to other organs. 30 day papers signed [facility] instructions ROB/GBS in 2 weeks Farrel Conners, CNM

## 2017-09-28 ENCOUNTER — Ambulatory Visit (INDEPENDENT_AMBULATORY_CARE_PROVIDER_SITE_OTHER): Payer: Medicaid Other | Admitting: Obstetrics and Gynecology

## 2017-09-28 VITALS — BP 108/60 | Wt 167.0 lb

## 2017-09-28 DIAGNOSIS — Z3480 Encounter for supervision of other normal pregnancy, unspecified trimester: Secondary | ICD-10-CM

## 2017-09-28 DIAGNOSIS — Z3A36 36 weeks gestation of pregnancy: Secondary | ICD-10-CM

## 2017-09-28 DIAGNOSIS — Z3685 Encounter for antenatal screening for Streptococcus B: Secondary | ICD-10-CM

## 2017-09-28 NOTE — Progress Notes (Signed)
GBS/Aptima today. No complaints 

## 2017-09-28 NOTE — Progress Notes (Signed)
    Routine Prenatal Care Visit  Subjective  Chestine Adolphus [name] is a 31 y.o. [id number] at 4746w3d being seen today for ongoing prenatal care.  She is currently monitored for the following issues for this low-risk pregnancy and has Supervision of other normal pregnancy, antepartum on their problem list.  ----------------------------------------------------------------------------------- Patient reports no complaints.   Contractions: Irregular. Vag. Bleeding: None.  Movement: Present. Denies leaking of fluid.  ----------------------------------------------------------------------------------- The following portions of the patient's history were reviewed and updated as appropriate: allergies, current medications, past family history, past medical history, past social history, past surgical history and problem list. Problem list updated.   Objective  Blood pressure 108/60, weight 167 lb (75.8 kg), last menstrual period 01/16/2017. Pregravid weight 136 lb (61.7 kg) Total Weight Gain 31 lb (14.1 kg) Urinalysis: Urine Protein: Negative Urine Glucose: Negative  Fetal Status: Fetal Heart Rate (bpm): 135 Fundal Height: 35 cm Movement: Present  Presentation: Vertex  General:  Alert, oriented and cooperative. Patient is in no acute distress.  Skin: Skin is warm and dry. No rash noted.   Cardiovascular: Normal heart rate noted  Respiratory: Normal respiratory effort, no problems with respiration noted  Abdomen: Soft, gravid, appropriate for gestational age. Pain/Pressure: Present     Pelvic:  Cervical exam performed Dilation: 1 Effacement (%): 50 Station: -2  Extremities: Normal range of motion.     ental Status: Normal mood and affect. Normal behavior. Normal judgment and thought content.     Assessment   31 y.o. [id number] at 5146w3d by  10/23/2017, by Last Menstrual Period presenting for routine prenatal visit  Plan   pregnancy #4 Problems (from 01/16/17 to present)    Problem Noted Resolved   Supervision of other normal pregnancy, antepartum 04/14/2017 by Conard NovakJackson, Stephen D, MD No   Overview Addendum 07/30/2017  8:00 AM by Conard NovakJackson, Stephen D, MD    [facility] Prenatal Labs  Dating L=9 Blood type: O/Positive/-- (05/23 1551)   Genetic Screen 1 Screen:    AFP:     Quad:     NIPS: Antibody:Negative (05/23 1551)  Anatomic US  Rubella: 1.70 (05/23 1551)  Varicella: Immune  GTT Third trimester: 78 RPR: Non Reactive (05/23 1551)   Rhogam  HBsAg: Negative (05/23 1551)   TDaP vaccine                       Flu Shot: HIV: Non Reactive (05/23 1551)   Baby Food                                GBS:   Contraception  Pap:  CBB     CS/VBAC    Support Person                   Term labor symptoms and general obstetric precautions including but not limited to vaginal bleeding, contractions, leaking of fluid and fetal movement were reviewed in detail with the patient. Please refer to After Visit Summary for other counseling recommendations.  - GBS today Return in about 1 week (around 10/05/2017) for ROB.

## 2017-09-30 LAB — STREP GP B NAA: Strep Gp B NAA: NEGATIVE

## 2017-10-06 ENCOUNTER — Ambulatory Visit (INDEPENDENT_AMBULATORY_CARE_PROVIDER_SITE_OTHER): Payer: Medicaid Other | Admitting: Obstetrics and Gynecology

## 2017-10-06 ENCOUNTER — Encounter: Payer: Medicaid Other | Admitting: Obstetrics and Gynecology

## 2017-10-06 VITALS — BP 100/60 | Wt 169.0 lb

## 2017-10-06 DIAGNOSIS — Z3A37 37 weeks gestation of pregnancy: Secondary | ICD-10-CM

## 2017-10-06 DIAGNOSIS — Z3480 Encounter for supervision of other normal pregnancy, unspecified trimester: Secondary | ICD-10-CM

## 2017-10-06 NOTE — Progress Notes (Signed)
    Routine Prenatal Care Visit  Subjective  Whitney [name] is a 31 y.o. [id number] at [redacted]w[redacted]d being seen today for ongoing prenatal care.  She is currently monitored for the following issues for this low-risk pregnancy and has Supervision of other normal pregnancy, antepartum on their problem list.  ----------------------------------------------------------------------------------- Patient reports no complaints.   Contractions: Not present. Vag. Bleeding: Scant.  Movement: Present. Denies leaking of fluid.  ----------------------------------------------------------------------------------- The following portions of the patient's history were reviewed and updated as appropriate: allergies, current medications, past family history, past medical history, past social history, past surgical history and problem list. Problem list updated.   Objective  Blood pressure 100/60, weight 169 lb (76.7 kg), last menstrual period 01/16/2017. Pregravid weight 136 lb (61.7 kg) Total Weight Gain 33 lb (15 kg) Urinalysis: Urine Protein: Negative Urine Glucose: Negative  Fetal Status: Fetal Heart Rate (bpm): 135 Fundal Height: 37 cm Movement: Present  Presentation: Vertex  General:  Alert, oriented and cooperative. Patient is in no acute distress.  Skin: Skin is warm and dry. No rash noted.   Cardiovascular: Normal heart rate noted  Respiratory: Normal respiratory effort, no problems with respiration noted  Abdomen: Soft, gravid, appropriate for gestational age. Pain/Pressure: Present     Pelvic:  Cervical exam performed Dilation: 1 Effacement (%): 50 Station: -3  Extremities: Normal range of motion.     ental Status: Normal mood and affect. Normal behavior. Normal judgment and thought content.     Assessment   31 y.o. [id number] at [redacted]w[redacted]d by  10/23/2017, by Last Menstrual Period presenting for routine prenatal visit  Plan   pregnancy #4 Problems (from 01/16/17 to present)    Problem Noted Resolved   Supervision of other normal pregnancy, antepartum 04/14/2017 by Conard Novak, MD No   Overview Addendum 10/02/2017 10:50 AM by Vena Austria, MD    [facility] Prenatal Labs  Dating L=9 Blood type: O/Positive/-- (05/23 1551)   Genetic Screen 1 Screen:    AFP:     Quad:     NIPS: Antibody:Negative (05/23 1551)  Anatomic [place]  Rubella: 1.70 (05/23 1551)  Varicella: Immune  GTT Third trimester: 78 RPR: Non Reactive (05/23 1551)   Rhogam  HBsAg: Negative (05/23 1551)   TDaP vaccine                       Flu Shot: HIV: Non Reactive (05/23 1551)   Baby Food                                GBS: negative  Contraception  BTL postpartum Pap: NIL  CBB     CS/VBAC    Support Person                   Term labor symptoms and general obstetric precautions including but not limited to vaginal bleeding, contractions, leaking of fluid and fetal movement were reviewed in detail with the patient. Please refer to After Visit Summary for other counseling recommendations.   Patient desires membrane sweeping at next appointment Gonorrhea/Chlamydia collected today Encouraged smoking cessation  Return in about 1 week (around 10/13/2017) for ROB.

## 2017-10-08 LAB — GC/CHLAMYDIA PROBE AMP
Chlamydia trachomatis, NAA: NEGATIVE
Neisseria gonorrhoeae by PCR: NEGATIVE

## 2017-10-11 NOTE — Progress Notes (Signed)
Called and discussed negative with patient

## 2017-10-13 ENCOUNTER — Encounter: Payer: Self-pay | Admitting: Maternal Newborn

## 2017-10-13 ENCOUNTER — Ambulatory Visit (INDEPENDENT_AMBULATORY_CARE_PROVIDER_SITE_OTHER): Payer: Medicaid Other | Admitting: Maternal Newborn

## 2017-10-13 VITALS — BP 100/70 | Wt 166.0 lb

## 2017-10-13 DIAGNOSIS — Z3480 Encounter for supervision of other normal pregnancy, unspecified trimester: Secondary | ICD-10-CM

## 2017-10-13 DIAGNOSIS — Z3A38 38 weeks gestation of pregnancy: Secondary | ICD-10-CM

## 2017-10-13 NOTE — Progress Notes (Signed)
    Routine Prenatal Care Visit  Subjective  Aloni Adolphus Birchwood is a 31 y.o. [id number] at [redacted]w[redacted]d being seen today for ongoing prenatal care.  She is currently monitored for the following issues for this low-risk pregnancy and has Supervision of other normal pregnancy, antepartum on their problem list.  ----------------------------------------------------------------------------------- Patient reports occasional contractions.   Contractions: Irregular. Vag. Bleeding: None.  Movement: Present. Denies leaking of fluid.  ----------------------------------------------------------------------------------- The following portions of the patient's history were reviewed and updated as appropriate: allergies, current medications, past family history, past medical history, past social history, past surgical history and problem list. Problem list updated.   Objective  Last menstrual period 01/16/2017. Pregravid weight 136 lb (61.7 kg) Total Weight Gain 30 lb (13.6 kg) Urinalysis: Urine Protein: Negative Urine Glucose: Negative  Fetal Status: Fetal Heart Rate (bpm): 138 Fundal Height: 37 cm Movement: Present  Presentation: Vertex  General:  Alert, oriented and cooperative. Patient is in no acute distress.  Skin: Skin is warm and dry. No rash noted.   Cardiovascular: Normal heart rate noted  Respiratory: Normal respiratory effort, no problems with respiration noted  Abdomen: Soft, gravid, appropriate for gestational age. Pain/Pressure: Present     Pelvic:  Cervical exam performed Dilation: 1 Effacement (%): 50 Station: -3  Extremities: Normal range of motion.  Edema: None  Mental Status: Normal mood and affect. Normal behavior. Normal judgment and thought content.     Assessment   31 y.o. [id number] at [redacted]w[redacted]d, EDD 10/23/2017 by Last Menstrual Period presenting for routine prenatal visit.  Plan   pregnancy #4 Problems (from 01/16/17 to present)    Problem Noted Resolved   Supervision of other normal  pregnancy, antepartum 04/14/2017 by Conard Novak, MD No   Overview Addendum 10/06/2017  4:05 PM by Natale Milch, MD    [facility] Prenatal Labs  Dating L=9 Blood type: O/Positive/-- (05/23 1551)   Genetic Screen 1 Screen:    AFP:     Quad:     NIPS: Antibody:Negative (05/23 1551)  Anatomic [place]  Rubella: 1.70 (05/23 1551)  Varicella: Immune  GTT Third trimester: 78 RPR: Non Reactive (05/23 1551)   Rhogam  HBsAg: Negative (05/23 1551)   TDaP vaccine                       Flu Shot: HIV: Non Reactive (05/23 1551)   Baby Food                                GBS: negative  Contraception  BTL postpartum Pap: NIL  CBB     CS/VBAC    Support Person                Attempted to strip membranes today, cervix was high and posterior, unable to complete.   Term labor symptoms and general obstetric precautions including but not limited to vaginal bleeding, contractions, leaking of fluid and fetal movement were reviewed in detail with the patient.  Please refer to After Visit Summary for other counseling recommendations.   Return in about 1 week (around 10/20/2017) for ROB.  Marcelyn Bruins, CNM 10/13/2017  11:52 AM

## 2017-10-13 NOTE — Progress Notes (Signed)
C/o ?ctxs, desires cx ck and membranes stripped. rj

## 2017-10-13 NOTE — Patient Instructions (Signed)
Vaginal Delivery Vaginal delivery means that you will give birth by pushing your baby out of your birth canal (vagina). A team of health care providers will help you before, during, and after vaginal delivery. Birth experiences are unique for every woman and every pregnancy, and birth experiences vary depending on where you choose to give birth. What should I do to prepare for my baby's birth? Before your baby is born, it is important to talk with your health care provider about:  Your labor and delivery preferences. These may include: ? Medicines that you may be given. ? How you will manage your pain. This might include non-medical pain relief techniques or injectable pain relief such as epidural analgesia. ? How you and your baby will be monitored during labor and delivery. ? Who may be in the labor and delivery room with you. ? Your feelings about surgical delivery of your baby (cesarean delivery, or C-section) if this becomes necessary. ? Your feelings about receiving donated blood through an IV tube (blood transfusion) if this becomes necessary.  Whether you are able: ? To take pictures or videos of the birth. ? To eat during labor and delivery. ? To move around, walk, or change positions during labor and delivery.  What to expect after your baby is born, such as: ? Whether delayed umbilical cord clamping and cutting is offered. ? Who will care for your baby right after birth. ? Medicines or tests that may be recommended for your baby. ? Whether breastfeeding is supported in your hospital or birth center. ? How long you will be in the hospital or birth center.  How any medical conditions you have may affect your baby or your labor and delivery experience.  To prepare for your baby's birth, you should also:  Attend all of your health care visits before delivery (prenatal visits) as recommended by your health care provider. This is important.  Prepare your home for your baby's  arrival. Make sure that you have: ? Diapers. ? Baby clothing. ? Feeding equipment. ? Safe sleeping arrangements for you and your baby.  Install a car seat in your vehicle. Have your car seat checked by a certified car seat installer to make sure that it is installed safely.  Think about who will help you with your new baby at home for at least the first several weeks after delivery.  What can I expect when I arrive at the birth center or hospital? Once you are in labor and have been admitted into the hospital or birth center, your health care provider may:  Review your pregnancy history and any concerns you have.  Insert an IV tube into one of your veins. This is used to give you fluids and medicines.  Check your blood pressure, pulse, temperature, and heart rate (vital signs).  Check whether your bag of water (amniotic sac) has broken (ruptured).  Talk with you about your birth plan and discuss pain control options.  Monitoring Your health care provider may monitor your contractions (uterine monitoring) and your baby's heart rate (fetal monitoring). You may need to be monitored:  Often, but not continuously (intermittently).  All the time or for long periods at a time (continuously). Continuous monitoring may be needed if: ? You are taking certain medicines, such as medicine to relieve pain or make your contractions stronger. ? You have pregnancy or labor complications.  Monitoring may be done by:  Placing a special stethoscope or a handheld monitoring device on your abdomen to   check your baby's heartbeat, and feeling your abdomen for contractions. This method of monitoring does not continuously record your baby's heartbeat or your contractions.  Placing monitors on your abdomen (external monitors) to record your baby's heartbeat and the frequency and length of contractions. You may not have to wear external monitors all the time.  Placing monitors inside of your uterus  (internal monitors) to record your baby's heartbeat and the frequency, length, and strength of your contractions. ? Your health care provider may use internal monitors if he or she needs more information about the strength of your contractions or your baby's heart rate. ? Internal monitors are put in place by passing a thin, flexible wire through your vagina and into your uterus. Depending on the type of monitor, it may remain in your uterus or on your baby's head until birth. ? Your health care provider will discuss the benefits and risks of internal monitoring with you and will ask for your permission before inserting the monitors.  Telemetry. This is a type of continuous monitoring that can be done with external or internal monitors. Instead of having to stay in bed, you are able to move around during telemetry. Ask your health care provider if telemetry is an option for you.  Physical exam Your health care provider may perform a physical exam. This may include:  Checking whether your baby is positioned: ? With the head toward your vagina (head-down). This is most common. ? With the head toward the top of your uterus (head-up or breech). If your baby is in a breech position, your health care provider may try to turn your baby to a head-down position so you can deliver vaginally. If it does not seem that your baby can be born vaginally, your provider may recommend surgery to deliver your baby. In rare cases, you may be able to deliver vaginally if your baby is head-up (breech delivery). ? Lying sideways (transverse). Babies that are lying sideways cannot be delivered vaginally.  Checking your cervix to determine: ? Whether it is thinning out (effacing). ? Whether it is opening up (dilating). ? How low your baby has moved into your birth canal.  What are the three stages of labor and delivery?  Normal labor and delivery is divided into the following three stages: Stage 1  Stage 1 is the  longest stage of labor, and it can last for hours or days. Stage 1 includes: ? Early labor. This is when contractions may be irregular, or regular and mild. Generally, early labor contractions are more than 10 minutes apart. ? Active labor. This is when contractions get longer, more regular, more frequent, and more intense. ? The transition phase. This is when contractions happen very close together, are very intense, and may last longer than during any other part of labor.  Contractions generally feel mild, infrequent, and irregular at first. They get stronger, more frequent (about every 2-3 minutes), and more regular as you progress from early labor through active labor and transition.  Many women progress through stage 1 naturally, but you may need help to continue making progress. If this happens, your health care provider may talk with you about: ? Rupturing your amniotic sac if it has not ruptured yet. ? Giving you medicine to help make your contractions stronger and more frequent.  Stage 1 ends when your cervix is completely dilated to 4 inches (10 cm) and completely effaced. This happens at the end of the transition phase. Stage 2  Once   your cervix is completely effaced and dilated to 4 inches (10 cm), you may start to feel an urge to push. It is common for the body to naturally take a rest before feeling the urge to push, especially if you received an epidural or certain other pain medicines. This rest period may last for up to 1-2 hours, depending on your unique labor experience.  During stage 2, contractions are generally less painful, because pushing helps relieve contraction pain. Instead of contraction pain, you may feel stretching and burning pain, especially when the widest part of your baby's head passes through the vaginal opening (crowning).  Your health care provider will closely monitor your pushing progress and your baby's progress through the vagina during stage 2.  Your  health care provider may massage the area of skin between your vaginal opening and anus (perineum) or apply warm compresses to your perineum. This helps it stretch as the baby's head starts to crown, which can help prevent perineal tearing. ? In some cases, an incision may be made in your perineum (episiotomy) to allow the baby to pass through the vaginal opening. An episiotomy helps to make the opening of the vagina larger to allow more room for the baby to fit through.  It is very important to breathe and focus so your health care provider can control the delivery of your baby's head. Your health care provider may have you decrease the intensity of your pushing, to help prevent perineal tearing.  After delivery of your baby's head, the shoulders and the rest of the body generally deliver very quickly and without difficulty.  Once your baby is delivered, the umbilical cord may be cut right away, or this may be delayed for 1-2 minutes, depending on your baby's health. This may vary among health care providers, hospitals, and birth centers.  If you and your baby are healthy enough, your baby may be placed on your chest or abdomen to help maintain the baby's temperature and to help you bond with each other. Some mothers and babies start breastfeeding at this time. Your health care team will dry your baby and help keep your baby warm during this time.  Your baby may need immediate care if he or she: ? Showed signs of distress during labor. ? Has a medical condition. ? Was born too early (prematurely). ? Had a bowel movement before birth (meconium). ? Shows signs of difficulty transitioning from being inside the uterus to being outside of the uterus. If you are planning to breastfeed, your health care team will help you begin a feeding. Stage 3  The third stage of labor starts immediately after the birth of your baby and ends after you deliver the placenta. The placenta is an organ that develops  during pregnancy to provide oxygen and nutrients to your baby in the womb.  Delivering the placenta may require some pushing, and you may have mild contractions. Breastfeeding can stimulate contractions to help you deliver the placenta.  After the placenta is delivered, your uterus should tighten (contract) and become firm. This helps to stop bleeding in your uterus. To help your uterus contract and to control bleeding, your health care provider may: ? Give you medicine by injection, through an IV tube, by mouth, or through your rectum (rectally). ? Massage your abdomen or perform a vaginal exam to remove any blood clots that are left in your uterus. ? Empty your bladder by placing a thin, flexible tube (catheter) into your bladder. ? Encourage   you to breastfeed your baby. After labor is over, you and your baby will be monitored closely to ensure that you are both healthy until you are ready to go home. Your health care team will teach you how to care for yourself and your baby. This information is not intended to replace advice given to you by your health care provider. Make sure you discuss any questions you have with your health care provider. Document Released: 07/21/2008 Document Revised: 05/01/2016 Document Reviewed: 10/27/2015 Elsevier Interactive Patient Education  2018 [employer]  

## 2017-10-20 ENCOUNTER — Ambulatory Visit (INDEPENDENT_AMBULATORY_CARE_PROVIDER_SITE_OTHER): Payer: Medicaid Other | Admitting: Advanced Practice Midwife

## 2017-10-20 ENCOUNTER — Encounter: Payer: Self-pay | Admitting: Advanced Practice Midwife

## 2017-10-20 VITALS — BP 114/70 | Wt 170.0 lb

## 2017-10-20 DIAGNOSIS — Z3A39 39 weeks gestation of pregnancy: Secondary | ICD-10-CM

## 2017-10-20 NOTE — Patient Instructions (Signed)
Vaginal Delivery Vaginal delivery means that you will give birth by pushing your baby out of your birth canal (vagina). A team of health care providers will help you before, during, and after vaginal delivery. Birth experiences are unique for every woman and every pregnancy, and birth experiences vary depending on where you choose to give birth. What should I do to prepare for my baby's birth? Before your baby is born, it is important to talk with your health care provider about:  Your labor and delivery preferences. These may include: ? Medicines that you may be given. ? How you will manage your pain. This might include non-medical pain relief techniques or injectable pain relief such as epidural analgesia. ? How you and your baby will be monitored during labor and delivery. ? Who may be in the labor and delivery room with you. ? Your feelings about surgical delivery of your baby (cesarean delivery, or C-section) if this becomes necessary. ? Your feelings about receiving donated blood through an IV tube (blood transfusion) if this becomes necessary.  Whether you are able: ? To take pictures or videos of the birth. ? To eat during labor and delivery. ? To move around, walk, or change positions during labor and delivery.  What to expect after your baby is born, such as: ? Whether delayed umbilical cord clamping and cutting is offered. ? Who will care for your baby right after birth. ? Medicines or tests that may be recommended for your baby. ? Whether breastfeeding is supported in your hospital or birth center. ? How long you will be in the hospital or birth center.  How any medical conditions you have may affect your baby or your labor and delivery experience.  To prepare for your baby's birth, you should also:  Attend all of your health care visits before delivery (prenatal visits) as recommended by your health care provider. This is important.  Prepare your home for your baby's  arrival. Make sure that you have: ? Diapers. ? Baby clothing. ? Feeding equipment. ? Safe sleeping arrangements for you and your baby.  Install a car seat in your vehicle. Have your car seat checked by a certified car seat installer to make sure that it is installed safely.  Think about who will help you with your new baby at home for at least the first several weeks after delivery.  What can I expect when I arrive at the birth center or hospital? Once you are in labor and have been admitted into the hospital or birth center, your health care provider may:  Review your pregnancy history and any concerns you have.  Insert an IV tube into one of your veins. This is used to give you fluids and medicines.  Check your blood pressure, pulse, temperature, and heart rate (vital signs).  Check whether your bag of water (amniotic sac) has broken (ruptured).  Talk with you about your birth plan and discuss pain control options.  Monitoring Your health care provider may monitor your contractions (uterine monitoring) and your baby's heart rate (fetal monitoring). You may need to be monitored:  Often, but not continuously (intermittently).  All the time or for long periods at a time (continuously). Continuous monitoring may be needed if: ? You are taking certain medicines, such as medicine to relieve pain or make your contractions stronger. ? You have pregnancy or labor complications.  Monitoring may be done by:  Placing a special stethoscope or a handheld monitoring device on your abdomen to   check your baby's heartbeat, and feeling your abdomen for contractions. This method of monitoring does not continuously record your baby's heartbeat or your contractions.  Placing monitors on your abdomen (external monitors) to record your baby's heartbeat and the frequency and length of contractions. You may not have to wear external monitors all the time.  Placing monitors inside of your uterus  (internal monitors) to record your baby's heartbeat and the frequency, length, and strength of your contractions. ? Your health care provider may use internal monitors if he or she needs more information about the strength of your contractions or your baby's heart rate. ? Internal monitors are put in place by passing a thin, flexible wire through your vagina and into your uterus. Depending on the type of monitor, it may remain in your uterus or on your baby's head until birth. ? Your health care provider will discuss the benefits and risks of internal monitoring with you and will ask for your permission before inserting the monitors.  Telemetry. This is a type of continuous monitoring that can be done with external or internal monitors. Instead of having to stay in bed, you are able to move around during telemetry. Ask your health care provider if telemetry is an option for you.  Physical exam Your health care provider may perform a physical exam. This may include:  Checking whether your baby is positioned: ? With the head toward your vagina (head-down). This is most common. ? With the head toward the top of your uterus (head-up or breech). If your baby is in a breech position, your health care provider may try to turn your baby to a head-down position so you can deliver vaginally. If it does not seem that your baby can be born vaginally, your provider may recommend surgery to deliver your baby. In rare cases, you may be able to deliver vaginally if your baby is head-up (breech delivery). ? Lying sideways (transverse). Babies that are lying sideways cannot be delivered vaginally.  Checking your cervix to determine: ? Whether it is thinning out (effacing). ? Whether it is opening up (dilating). ? How low your baby has moved into your birth canal.  What are the three stages of labor and delivery?  Normal labor and delivery is divided into the following three stages: Stage 1  Stage 1 is the  longest stage of labor, and it can last for hours or days. Stage 1 includes: ? Early labor. This is when contractions may be irregular, or regular and mild. Generally, early labor contractions are more than 10 minutes apart. ? Active labor. This is when contractions get longer, more regular, more frequent, and more intense. ? The transition phase. This is when contractions happen very close together, are very intense, and may last longer than during any other part of labor.  Contractions generally feel mild, infrequent, and irregular at first. They get stronger, more frequent (about every 2-3 minutes), and more regular as you progress from early labor through active labor and transition.  Many women progress through stage 1 naturally, but you may need help to continue making progress. If this happens, your health care provider may talk with you about: ? Rupturing your amniotic sac if it has not ruptured yet. ? Giving you medicine to help make your contractions stronger and more frequent.  Stage 1 ends when your cervix is completely dilated to 4 inches (10 cm) and completely effaced. This happens at the end of the transition phase. Stage 2  Once   your cervix is completely effaced and dilated to 4 inches (10 cm), you may start to feel an urge to push. It is common for the body to naturally take a rest before feeling the urge to push, especially if you received an epidural or certain other pain medicines. This rest period may last for up to 1-2 hours, depending on your unique labor experience.  During stage 2, contractions are generally less painful, because pushing helps relieve contraction pain. Instead of contraction pain, you may feel stretching and burning pain, especially when the widest part of your baby's head passes through the vaginal opening (crowning).  Your health care provider will closely monitor your pushing progress and your baby's progress through the vagina during stage 2.  Your  health care provider may massage the area of skin between your vaginal opening and anus (perineum) or apply warm compresses to your perineum. This helps it stretch as the baby's head starts to crown, which can help prevent perineal tearing. ? In some cases, an incision may be made in your perineum (episiotomy) to allow the baby to pass through the vaginal opening. An episiotomy helps to make the opening of the vagina larger to allow more room for the baby to fit through.  It is very important to breathe and focus so your health care provider can control the delivery of your baby's head. Your health care provider may have you decrease the intensity of your pushing, to help prevent perineal tearing.  After delivery of your baby's head, the shoulders and the rest of the body generally deliver very quickly and without difficulty.  Once your baby is delivered, the umbilical cord may be cut right away, or this may be delayed for 1-2 minutes, depending on your baby's health. This may vary among health care providers, hospitals, and birth centers.  If you and your baby are healthy enough, your baby may be placed on your chest or abdomen to help maintain the baby's temperature and to help you bond with each other. Some mothers and babies start breastfeeding at this time. Your health care team will dry your baby and help keep your baby warm during this time.  Your baby may need immediate care if he or she: ? Showed signs of distress during labor. ? Has a medical condition. ? Was born too early (prematurely). ? Had a bowel movement before birth (meconium). ? Shows signs of difficulty transitioning from being inside the uterus to being outside of the uterus. If you are planning to breastfeed, your health care team will help you begin a feeding. Stage 3  The third stage of labor starts immediately after the birth of your baby and ends after you deliver the placenta. The placenta is an organ that develops  during pregnancy to provide oxygen and nutrients to your baby in the womb.  Delivering the placenta may require some pushing, and you may have mild contractions. Breastfeeding can stimulate contractions to help you deliver the placenta.  After the placenta is delivered, your uterus should tighten (contract) and become firm. This helps to stop bleeding in your uterus. To help your uterus contract and to control bleeding, your health care provider may: ? Give you medicine by injection, through an IV tube, by mouth, or through your rectum (rectally). ? Massage your abdomen or perform a vaginal exam to remove any blood clots that are left in your uterus. ? Empty your bladder by placing a thin, flexible tube (catheter) into your bladder. ? Encourage   you to breastfeed your baby. After labor is over, you and your baby will be monitored closely to ensure that you are both healthy until you are ready to go home. Your health care team will teach you how to care for yourself and your baby. This information is not intended to replace advice given to you by your health care provider. Make sure you discuss any questions you have with your health care provider. Document Released: 07/21/2008 Document Revised: 05/01/2016 Document Reviewed: 10/27/2015 Elsevier Interactive Patient Education  2018 [employer]  

## 2017-10-20 NOTE — Progress Notes (Signed)
No vb. No lof.  

## 2017-10-20 NOTE — Progress Notes (Addendum)
  Routine Prenatal Care Visit  Subjective  Whitney [name] is a 31 y.o. [id number] at 6431w4d being seen today for ongoing prenatal care.  She is currently monitored for the following issues for this low-risk pregnancy and has Supervision of other normal pregnancy, antepartum on their problem list.  ----------------------------------------------------------------------------------- Patient reports no complaints.   Contractions: Irritability. Vag. Bleeding: None.  Movement: Present. Denies leaking of fluid.  ----------------------------------------------------------------------------------- The following portions of the patient's history were reviewed and updated as appropriate: allergies, current medications, past family history, past medical history, past social history, past surgical history and problem list. Problem list updated.   Objective  Blood pressure 114/70, weight 170 lb (77.1 kg), last menstrual period 01/16/2017. Pregravid weight 136 lb (61.7 kg) Total Weight Gain 34 lb (15.4 kg) Urinalysis: Urine Protein: Negative Urine Glucose: Negative  Fetal Status: Fetal Heart Rate (bpm): 150 Fundal Height: 37 cm Movement: Present  Presentation: Vertex  General:  Alert, oriented and cooperative. Patient is in no acute distress.  Skin: Skin is warm and dry. No rash noted.   Cardiovascular: Normal heart rate noted  Respiratory: Normal respiratory effort, no problems with respiration noted  Abdomen: Soft, gravid, appropriate for gestational age. Pain/Pressure: Present     Pelvic:  Cervical exam performed Dilation: 2 Effacement (%): 60 Station: -2 Cervical sweep done  Extremities: Normal range of motion.  Edema: None  Mental Status: Normal mood and affect. Normal behavior. Normal judgment and thought content.   Assessment   31 y.o. [id number] at 4231w4d by  10/23/2017, by Last Menstrual Period presenting for routine prenatal visit  Plan   pregnancy #4 Problems (from 01/16/17 to present)    Problem Noted Resolved   Supervision of other normal pregnancy, antepartum 04/14/2017 by Conard NovakJackson, Stephen D, MD No   Overview Addendum 10/06/2017  4:05 PM by Natale MilchSchuman, Christanna R, MD    [facility] Prenatal Labs  Dating L=9 Blood type: O/Positive/-- (05/23 1551)   Genetic Screen 1 Screen:    AFP:     Quad:     NIPS: Antibody:Negative (05/23 1551)  Anatomic US  Rubella: 1.70 (05/23 1551)  Varicella: Immune  GTT Third trimester: 78 RPR: Non Reactive (05/23 1551)   Rhogam  HBsAg: Negative (05/23 1551)   TDaP vaccine                       Flu Shot: HIV: Non Reactive (05/23 1551)   Baby Food                                GBS: negative  Contraception  BTL postpartum Pap: NIL  CBB     CS/VBAC    Support Person                   Term labor symptoms and general obstetric precautions including but not limited to vaginal bleeding, contractions, leaking of fluid and fetal movement were reviewed in detail with the patient. Please refer to After Visit Summary for other counseling recommendations.   Return in about 1 week (around 10/27/2017) for rob.  IOL scheduled for 10/30/2017, 8 AM  Tresea MallJane Avah Bashor, [facility] CenterCNM  10/20/2017 11:32 AM

## 2017-10-21 ENCOUNTER — Inpatient Hospital Stay
Admission: EM | Admit: 2017-10-21 | Discharge: 2017-10-22 | DRG: 798 | Disposition: A | Discharge status: Home / Self Care | Payer: Medicaid Other | Attending: Obstetrics & Gynecology | Admitting: Obstetrics & Gynecology

## 2017-10-21 ENCOUNTER — Other Ambulatory Visit: Payer: Self-pay

## 2017-10-21 DIAGNOSIS — O368130 Decreased fetal movements, third trimester, not applicable or unspecified: Secondary | ICD-10-CM | POA: Diagnosis present

## 2017-10-21 DIAGNOSIS — Z302 Encounter for sterilization: Secondary | ICD-10-CM | POA: Diagnosis not present

## 2017-10-21 DIAGNOSIS — F17210 Nicotine dependence, cigarettes, uncomplicated: Secondary | ICD-10-CM | POA: Diagnosis present

## 2017-10-21 DIAGNOSIS — O99334 Smoking (tobacco) complicating childbirth: Principal | ICD-10-CM | POA: Diagnosis present

## 2017-10-21 DIAGNOSIS — Z3A39 39 weeks gestation of pregnancy: Secondary | ICD-10-CM

## 2017-10-21 DIAGNOSIS — Z3483 Encounter for supervision of other normal pregnancy, third trimester: Secondary | ICD-10-CM | POA: Diagnosis present

## 2017-10-21 DIAGNOSIS — Z3480 Encounter for supervision of other normal pregnancy, unspecified trimester: Secondary | ICD-10-CM

## 2017-10-21 LAB — CBC
HEMATOCRIT: 34.3 % — AB (ref 35.0–47.0)
HEMOGLOBIN: 11.8 g/dL — AB (ref 12.0–16.0)
MCH: 33.6 pg (ref 26.0–34.0)
MCHC: 34.3 g/dL (ref 32.0–36.0)
MCV: 97.9 fL (ref 80.0–100.0)
Platelets: 292 10*3/uL (ref 150–440)
RBC: 3.51 MIL/uL — AB (ref 3.80–5.20)
RDW: 13.5 % (ref 11.5–14.5)
WBC: 20.4 10*3/uL — ABNORMAL HIGH (ref 3.6–11.0)

## 2017-10-21 LAB — TYPE AND SCREEN
ABO/RH(D): O POS
Antibody Screen: NEGATIVE

## 2017-10-21 MED ORDER — ONDANSETRON HCL 4 MG PO TABS: 4.0000 mg | ORAL_TABLET | ORAL | Status: DC | PRN

## 2017-10-21 MED ORDER — SENNOSIDES-DOCUSATE SODIUM 8.6-50 MG PO TABS: 2.0000 | ORAL_TABLET | ORAL | Status: DC

## 2017-10-21 MED ORDER — ACETAMINOPHEN 325 MG PO TABS: 650.0000 mg | ORAL_TABLET | ORAL | Status: DC | PRN

## 2017-10-21 MED ORDER — CEFAZOLIN SODIUM-DEXTROSE 2-4 GM/100ML-% IV SOLN
2.0000 g | INTRAVENOUS | Status: AC
  Administered 2017-10-22: 2 g via INTRAVENOUS
  Filled 2017-10-21: qty 100

## 2017-10-21 MED ORDER — MISOPROSTOL 200 MCG PO TABS
ORAL_TABLET | ORAL | Status: AC
  Filled 2017-10-21: qty 4

## 2017-10-21 MED ORDER — SIMETHICONE 80 MG PO CHEW: 80.0000 mg | CHEWABLE_TABLET | ORAL | Status: DC | PRN

## 2017-10-21 MED ORDER — DIBUCAINE 1 % RE OINT: 1.0000 "application " | TOPICAL_OINTMENT | RECTAL | Status: DC | PRN

## 2017-10-21 MED ORDER — COCONUT OIL OIL: 1.0000 "application " | TOPICAL_OIL | Status: DC | PRN

## 2017-10-21 MED ORDER — WITCH HAZEL-GLYCERIN EX PADS: 1.0000 "application " | MEDICATED_PAD | CUTANEOUS | Status: DC | PRN

## 2017-10-21 MED ORDER — ONDANSETRON HCL 4 MG/2ML IJ SOLN: 4.0000 mg | Freq: Four times a day (QID) | INTRAMUSCULAR | Status: DC | PRN

## 2017-10-21 MED ORDER — OXYTOCIN BOLUS FROM INFUSION
500.0000 mL | Freq: Once | INTRAVENOUS | Status: AC
  Administered 2017-10-21: 500 mL via INTRAVENOUS

## 2017-10-21 MED ORDER — LIDOCAINE HCL (PF) 1 % IJ SOLN: 30.0000 mL | INTRAMUSCULAR | Status: DC | PRN

## 2017-10-21 MED ORDER — DIPHENHYDRAMINE HCL 25 MG PO CAPS: 25.0000 mg | ORAL_CAPSULE | Freq: Four times a day (QID) | ORAL | Status: DC | PRN

## 2017-10-21 MED ORDER — OXYTOCIN 40 UNITS IN LACTATED RINGERS INFUSION - SIMPLE MED
2.5000 [IU]/h | INTRAVENOUS | Status: DC
  Administered 2017-10-21: 2.5 [IU]/h via INTRAVENOUS

## 2017-10-21 MED ORDER — BENZOCAINE-MENTHOL 20-0.5 % EX AERO: 1.0000 "application " | INHALATION_SPRAY | CUTANEOUS | Status: DC | PRN

## 2017-10-21 MED ORDER — ONDANSETRON HCL 4 MG/2ML IJ SOLN
4.0000 mg | INTRAMUSCULAR | Status: DC | PRN
  Administered 2017-10-22: 4 mg via INTRAVENOUS

## 2017-10-21 MED ORDER — LACTATED RINGERS IV SOLN: 500.0000 mL | INTRAVENOUS | Status: DC | PRN

## 2017-10-21 MED ORDER — LIDOCAINE HCL (PF) 1 % IJ SOLN
INTRAMUSCULAR | Status: AC
  Filled 2017-10-21: qty 30

## 2017-10-21 MED ORDER — PRENATAL MULTIVITAMIN CH: 1.0000 | ORAL_TABLET | Freq: Every day | ORAL | Status: DC

## 2017-10-21 MED ORDER — OXYTOCIN 10 UNIT/ML IJ SOLN
INTRAMUSCULAR | Status: AC
  Filled 2017-10-21: qty 2

## 2017-10-21 MED ORDER — OXYTOCIN 40 UNITS IN LACTATED RINGERS INFUSION - SIMPLE MED
INTRAVENOUS | Status: AC
  Filled 2017-10-21: qty 1000

## 2017-10-21 MED ORDER — AMMONIA AROMATIC IN INHA
RESPIRATORY_TRACT | Status: AC
  Filled 2017-10-21: qty 10

## 2017-10-21 MED ORDER — BUTORPHANOL TARTRATE 1 MG/ML IJ SOLN: 1.0000 mg | INTRAMUSCULAR | Status: DC | PRN

## 2017-10-21 MED ORDER — LACTATED RINGERS IV SOLN
INTRAVENOUS | Status: DC
  Administered 2017-10-21: 09:00:00 via INTRAVENOUS

## 2017-10-21 MED ORDER — TETANUS-DIPHTH-ACELL PERTUSSIS 5-2.5-18.5 LF-MCG/0.5 IM SUSP: 0.5000 mL | Freq: Once | INTRAMUSCULAR | Status: DC

## 2017-10-21 MED ORDER — IBUPROFEN 600 MG PO TABS
600.0000 mg | ORAL_TABLET | Freq: Four times a day (QID) | ORAL | Status: DC
  Administered 2017-10-22 (×2): 600 mg via ORAL
  Filled 2017-10-21 (×2): qty 1

## 2017-10-21 NOTE — OB Triage Note (Signed)
Pt G4P2 complains of ctx and leaking of fluid since 0500 this morning. Pt states ctx have been every 2-3 minutes since and rates the pain a 10/10. Pt states no gush of fluid, but a trickle since 0500 this AM. Pt reports decreased fetal movement. VSS. Monitors applied and assessing.

## 2017-10-21 NOTE — Discharge Summary (Signed)
OB Discharge Summary     Patient Name: Whitney [name] DOB: [birth year] MRN: [id number]  Date of admission: 10/21/2017 Delivering provider: Tresea Mall, CNM  Date of Delivery: 10/21/2017  Date of discharge: 10/22/2017  Admitting diagnosis: 40 wks preg contractions Intrauterine pregnancy: [redacted]w[redacted]d     Secondary diagnosis: None     Discharge diagnosis: Term Pregnancy Delivered                                                                                                Post partum procedures:none  Augmentation: none  Complications: None  Hospital course:  Onset of Labor With Vaginal Delivery      31 y.o. yo [phone] at [redacted]w[redacted]d was admitted in Active Labor on 10/21/2017.  Patient had an uncomplicated labor course as follows:  Membrane Rupture Time/Date: 5:00 AM ,10/21/2017   Patient had delivery of viable female at 9:36 AM, 10/21/2017. Details of delivery can be found in a separate delivery note.   Patient had an uncomplicated postpartum course.   She is ambulating, tolerating a regular diet, passing flatus, and urinating well.  Patient is discharged home in stable condition on 10/22/2017.   Physical exam  Vitals:   10/22/17 1155 10/22/17 1159 10/22/17 1219 10/22/17 1700  BP:  107/70 107/66 (!) 112/59  Pulse: 94 78 79 87  Resp: (!) 25 17 18    Temp:  (!) 97 F (36.1 C) (!) 97.4 F (36.3 C) 97.7 F (36.5 C)  TempSrc:   Oral Oral  SpO2: 100% 100% 100% 98%  Weight:      Height:       General: alert, cooperative and no distress Lochia: appropriate Uterine Fundus: firm Incision: N/A DVT Evaluation: No evidence of DVT seen on physical exam.  Labs: Lab Results  Component Value Date   WBC 15.2 (H) 10/22/2017   HGB 9.9 (L) 10/22/2017   HCT 28.9 (L) 10/22/2017   MCV 98.7 10/22/2017   PLT 270 10/22/2017    Discharge instruction: per After Visit Summary.  Medications:  Allergies as of 10/22/2017   No Known Allergies     Medication List    TAKE these medications    acetaminophen 500 MG tablet Commonly known as:  TYLENOL Take 1,000 mg by mouth every 6 (six) hours as needed for moderate pain.   multivitamin-prenatal 27-0.8 MG Tabs tablet Take 1 tablet by mouth daily at 12 noon.            Discharge Care Instructions  (From admission, onward)        Start     Ordered   10/22/17 0000  Discharge wound care:    Comments:  SHOWER DAILY Wash incision gently with soap and water.  Call office with any drainage, redness, or firmness of the incision.   10/22/17 1822      Diet: routine diet  Activity: Advance as tolerated. Pelvic rest for 6 weeks.   Outpatient follow up: Follow-up Information    Tresea Mall, CNM. Schedule an appointment as soon as possible for a visit in 4 week(s).   Specialty:  Obstetrics Why:  for postpartum follow  up visit Contact information: [address] [place] [address] [phone]             Postpartum contraception: Tubal Ligation Rhogam Given postpartum: NA Rubella vaccine given postpartum: Rubella Immune Varicella vaccine given postpartum: Varicella Immune TDaP given antepartum or postpartum: yes  Newborn Data: Live born female  Birth Weight: 7 lb 6 oz (3345 g) APGAR: 8, 8  Newborn Delivery   Birth date/time:  10/21/2017 09:36:00 Delivery type:  Vaginal, Spontaneous      Baby Feeding: Breast  Disposition:home with mother  SIGNED:  Natale Milchhristanna R Donivan Thammavong, MD 10/22/2017 6:23 PM

## 2017-10-21 NOTE — H&P (Signed)
OB History & Physical   History of Present Illness:  Chief Complaint: Contractions and leaking fluid since 5 am this morning  HPI:  Whitney [name] is a 31 y.o. [id number] female at 7164w5d dated by LMP.  Her pregnancy has been uncomplicated.    She reports contractions.   She reports leakage of fluid.   She denies vaginal bleeding.   She reports fetal movement.   She had cervical sweep in the office yesterday. She admits to taking 1 ounce of castor oil and to having intercourse since being seen in the office.  Maternal Medical History:  History reviewed. No pertinent past medical history.  Past Surgical History:  Procedure Laterality Date  . DILATION AND CURETTAGE OF UTERUS      No Known Allergies  Prior to Admission medications   Medication Sig Start Date End Date Taking? Authorizing Provider  acetaminophen (TYLENOL) 500 MG tablet Take 1,000 mg by mouth every 6 (six) hours as needed for moderate pain.    [provider]  Prenatal Vit-Fe Fumarate-FA (MULTIVITAMIN-PRENATAL) 27-0.8 MG TABS tablet Take 1 tablet by mouth daily at 12 noon.    [provider]    OB History  Gravida Para Term Preterm AB Living  4 2 2   1 2   SAB TAB Ectopic Multiple Live Births  1       2    # Outcome Date GA Lbr Len/2nd Weight Sex Delivery Anes PTL Lv  4 Current           3 Term 09/13/14 5355w4d  7 lb 11 oz (3.487 kg) F Vag-Spont  N LIV  2 Term 11/04/06   8 lb 6 oz (3.799 kg) F Vag-Spont  N LIV  1 SAB               Prenatal care site: [facility]  Social History: She  reports that she has been smoking cigarettes.  She has been smoking about 0.75 packs per day. she has never used smokeless tobacco. She reports that she does not drink alcohol or use drugs.  Family History: family history includes Cancer in her other; Diabetes in her other; Gout in her father; Hyperlipidemia in her father.  She denies a family history of gynecologic cancers  Review of Systems: Negative x 10  systems reviewed except as noted in the HPI.    Physical Exam:  Vital Signs: BP 112/78 (BP Location: Left Arm)   Pulse 84   Temp 97.9 F (36.6 C) (Oral)   Resp 16   Ht 5\' 11"  (1.803 m)   Wt 170 lb (77.1 kg)   LMP 01/16/2017 (Exact Date)   BMI 23.71 kg/m  Constitutional: Well nourished, well developed female in no acute distress.  HEENT: normal Skin: Warm and dry.  Cardiovascular: Regular rate and rhythm.   Extremity: no edema  Respiratory: Clear to auscultation bilateral. Normal respiratory effort Abdomen: FHT present Back: no CVAT Neuro: DTRs 2+, Cranial nerves grossly intact Psych: Alert and Oriented x3. No memory deficits. Normal mood and affect.  MS: normal gait, normal bilateral lower extremity ROM/strength/stability.  Pelvic exam:  is not limited by body habitus EGBUS: within normal limits Vagina: within normal limits and with normal mucosa  Cervix: 8/90/0   Pertinent Results:  Prenatal Labs: Blood type/Rh O positive  Antibody screen negative  Rubella Immune  Varicella Immune    RPR Non-reactive  HBsAg negative  HIV negative  GC negative  Chlamydia negative  Genetic screening 1st trimester negative  1 hour GTT 78  3 hour GTT NA  GBS negative on 12/4   Baseline FHR: 125 beats/min   Variability: moderate   Accelerations: present   Decelerations: absent Contractions: present frequency: every 2-3 Overall assessment: Category I   Assessment:  Whitney [name] is a 31 y.o. Z6X0960 female at [redacted]w[redacted]d with active labor.   Plan:  1. Admit to Labor & Delivery  2. CBC, T&S, Clrs, IVF 3. GBS negative.   4. Fetal well-being: Category I 5. Expectant management for vaginal delivery  Tresea Mall, [facility] 10/21/2017 8:56 AM

## 2017-10-22 ENCOUNTER — Inpatient Hospital Stay: Payer: Medicaid Other | Admitting: Certified Registered Nurse Anesthetist

## 2017-10-22 ENCOUNTER — Encounter: Payer: Self-pay | Admitting: *Deleted

## 2017-10-22 ENCOUNTER — Encounter
Admission: EM | Disposition: A | Discharge status: Home / Self Care | Payer: Self-pay | Source: Home / Self Care | Attending: Obstetrics & Gynecology

## 2017-10-22 DIAGNOSIS — Z302 Encounter for sterilization: Secondary | ICD-10-CM

## 2017-10-22 HISTORY — PX: TUBAL LIGATION: SHX77

## 2017-10-22 LAB — CBC
HCT: 28.9 % — ABNORMAL LOW (ref 35.0–47.0)
HEMOGLOBIN: 9.9 g/dL — AB (ref 12.0–16.0)
MCH: 33.7 pg (ref 26.0–34.0)
MCHC: 34.2 g/dL (ref 32.0–36.0)
MCV: 98.7 fL (ref 80.0–100.0)
Platelets: 270 10*3/uL (ref 150–440)
RBC: 2.93 MIL/uL — AB (ref 3.80–5.20)
RDW: 13.6 % (ref 11.5–14.5)
WBC: 15.2 10*3/uL — AB (ref 3.6–11.0)

## 2017-10-22 LAB — RPR: RPR: NONREACTIVE

## 2017-10-22 SURGERY — LIGATION, FALLOPIAN TUBE, POSTPARTUM
Anesthesia: Spinal | Site: Abdomen | Laterality: Bilateral | Wound class: Clean Contaminated

## 2017-10-22 MED ORDER — FENTANYL CITRATE (PF) 100 MCG/2ML IJ SOLN
INTRAMUSCULAR | Status: AC
  Filled 2017-10-22: qty 2

## 2017-10-22 MED ORDER — MIDAZOLAM HCL 2 MG/2ML IJ SOLN
INTRAMUSCULAR | Status: AC
  Filled 2017-10-22: qty 2

## 2017-10-22 MED ORDER — LACTATED RINGERS IV SOLN
INTRAVENOUS | Status: DC
  Administered 2017-10-22 (×2): via INTRAVENOUS

## 2017-10-22 MED ORDER — EPHEDRINE SULFATE 50 MG/ML IJ SOLN
INTRAMUSCULAR | Status: DC | PRN
  Administered 2017-10-22: 10 mg via INTRAVENOUS

## 2017-10-22 MED ORDER — ONDANSETRON HCL 4 MG/2ML IJ SOLN
INTRAMUSCULAR | Status: DC | PRN
  Administered 2017-10-22: 4 mg via INTRAVENOUS

## 2017-10-22 MED ORDER — BUPIVACAINE HCL (PF) 0.5 % IJ SOLN
INTRAMUSCULAR | Status: AC
  Filled 2017-10-22: qty 30

## 2017-10-22 MED ORDER — CEFAZOLIN SODIUM-DEXTROSE 2-4 GM/100ML-% IV SOLN
INTRAVENOUS | Status: AC
  Filled 2017-10-22: qty 100

## 2017-10-22 MED ORDER — BUPIVACAINE HCL (PF) 0.5 % IJ SOLN
INTRAMUSCULAR | Status: AC
  Filled 2017-10-22: qty 10

## 2017-10-22 MED ORDER — SUCCINYLCHOLINE CHLORIDE 20 MG/ML IJ SOLN
INTRAMUSCULAR | Status: AC
  Filled 2017-10-22: qty 1

## 2017-10-22 MED ORDER — FENTANYL CITRATE (PF) 100 MCG/2ML IJ SOLN
INTRAMUSCULAR | Status: DC | PRN
  Administered 2017-10-22: 50 ug via INTRAVENOUS

## 2017-10-22 MED ORDER — BUPIVACAINE HCL 0.5 % IJ SOLN
INTRAMUSCULAR | Status: DC | PRN
  Administered 2017-10-22 (×2): 5 mL

## 2017-10-22 MED ORDER — KETOROLAC TROMETHAMINE 30 MG/ML IJ SOLN
INTRAMUSCULAR | Status: DC | PRN
  Administered 2017-10-22: 30 mg via INTRAVENOUS

## 2017-10-22 MED ORDER — MIDAZOLAM HCL 2 MG/2ML IJ SOLN
INTRAMUSCULAR | Status: DC | PRN
  Administered 2017-10-22 (×2): 1 mg via INTRAVENOUS

## 2017-10-22 MED ORDER — DEXAMETHASONE SODIUM PHOSPHATE 10 MG/ML IJ SOLN
INTRAMUSCULAR | Status: DC | PRN
  Administered 2017-10-22: 10 mg via INTRAVENOUS

## 2017-10-22 MED ORDER — FENTANYL CITRATE (PF) 100 MCG/2ML IJ SOLN: 25.0000 ug | INTRAMUSCULAR | Status: DC | PRN

## 2017-10-22 MED ORDER — ROCURONIUM BROMIDE 50 MG/5ML IV SOLN
INTRAVENOUS | Status: AC
  Filled 2017-10-22: qty 1

## 2017-10-22 MED ORDER — PHENYLEPHRINE HCL 10 MG/ML IJ SOLN
INTRAMUSCULAR | Status: DC | PRN
  Administered 2017-10-22: 100 ug via INTRAVENOUS
  Administered 2017-10-22: 50 ug via INTRAVENOUS

## 2017-10-22 MED ORDER — BUPIVACAINE IN DEXTROSE 0.75-8.25 % IT SOLN
INTRATHECAL | Status: DC | PRN
  Administered 2017-10-22: 2 mL via INTRATHECAL

## 2017-10-22 SURGICAL SUPPLY — 24 items
CHLORAPREP W/TINT 26ML (MISCELLANEOUS) ×2 IMPLANT
DERMABOND ADVANCED (GAUZE/BANDAGES/DRESSINGS) ×1
DERMABOND ADVANCED .7 DNX12 (GAUZE/BANDAGES/DRESSINGS) ×1 IMPLANT
DRAPE LAPAROTOMY T 102X78X121 (DRAPES) ×2 IMPLANT
DRSG TEGADERM 2-3/8X2-3/4 SM (GAUZE/BANDAGES/DRESSINGS) ×2 IMPLANT
DRSG TELFA 4X3 1S NADH ST (GAUZE/BANDAGES/DRESSINGS) ×2 IMPLANT
ELECT CAUTERY BLADE 6.4 (BLADE) ×2 IMPLANT
ELECT REM PT RETURN 9FT ADLT (ELECTROSURGICAL) ×2
ELECTRODE REM PT RTRN 9FT ADLT (ELECTROSURGICAL) ×1 IMPLANT
GLOVE BIO SURGEON STRL SZ8 (GLOVE) ×2 IMPLANT
GOWN STRL REUS W/ TWL LRG LVL3 (GOWN DISPOSABLE) ×1 IMPLANT
GOWN STRL REUS W/ TWL XL LVL3 (GOWN DISPOSABLE) ×1 IMPLANT
GOWN STRL REUS W/TWL LRG LVL3 (GOWN DISPOSABLE) ×1
GOWN STRL REUS W/TWL XL LVL3 (GOWN DISPOSABLE) ×1
LABEL OR SOLS (LABEL) ×2 IMPLANT
NEEDLE HYPO 22GX1.5 SAFETY (NEEDLE) ×2 IMPLANT
NS IRRIG 500ML POUR BTL (IV SOLUTION) ×2 IMPLANT
PACK BASIN MINOR ARMC (MISCELLANEOUS) ×2 IMPLANT
STRAP SAFETY BODY (MISCELLANEOUS) ×2 IMPLANT
SUT CHROMIC 0 CT 1 (SUTURE) ×6 IMPLANT
SUT VIC AB 0 SH 27 (SUTURE) ×2 IMPLANT
SUT VIC AB 2-0 UR6 27 (SUTURE) ×2 IMPLANT
SUT VIC AB 4-0 PS2 18 (SUTURE) IMPLANT
SYR 10ML LL (SYRINGE) ×2 IMPLANT

## 2017-10-22 NOTE — Progress Notes (Signed)
Patient ID: Whitney [name], female   DOB: 08-Oct-1986, 31 y.o.   MRN: [id number] Called by PACU nurses. Patient had spinal and was unable to void. She was straight cathed for 1450cc urine. Will monitor urine output closely. Will do timed void every 3 hours. If she is unable to void will place foley and start bladder training.

## 2017-10-22 NOTE — Lactation Note (Signed)
This note was copied from a baby's chart. Lactation Consultation Note  Patient Name: Whitney [name] XHFSF'S Date: 10/22/2017 Reason for consult: Follow-up assessment   Maternal Data Formula Feeding for Exclusion: No Does the patient have breastfeeding experience prior to this delivery?: Yes  Feeding Feeding Type: (did not observe feeding) Length of feed: 10 min  LATCH Score                   Interventions Interventions: (encouraged mom to breastfeed baby longer)  Mom states he falls asleep and other children did that  Lactation Tools Discussed/Used     Consult Status Consult Status: PRN    [name] 10/22/2017, 6:04 PM

## 2017-10-22 NOTE — Progress Notes (Signed)
Patient ID: Whitney [name], female   DOB: 09-10-1986, 31 y.o.   MRN: [id number] Admit Date: 10/21/2017 Today's Date: 10/22/2017  Post Partum Day 1  Subjective:  no complaints, up ad lib, voiding, tolerating PO and + flatus  Objective: Temp:  [97.7 F (36.5 C)-98.1 F (36.7 C)] 97.7 F (36.5 C) (12/28 0022) Pulse Rate:  [76-94] 89 (12/28 0022) Resp:  [16-18] 18 (12/28 0022) BP: (97-118)/(63-78) 108/63 (12/28 0022) SpO2:  [98 %] 98 % (12/27 1607) Weight:  [170 lb (77.1 kg)] 170 lb (77.1 kg) (12/27 0804)  Physical Exam:  General: alert and cooperative Lochia: appropriate Uterine Fundus: firm Incision: none DVT Evaluation: No evidence of DVT seen on physical exam. No cords or calf tenderness. No significant calf/ankle edema.  Recent Labs    10/21/17 0828 10/22/17 0448  HGB 11.8* 9.9*  HCT 34.3* 28.9*    Assessment/Plan: Contraception Bilateral tubal ligation planned for today. and Infant doing well  Patient requesting early discharge home after tubal ligation. RH + Rubella and varicella immune Flu and Tdap up to date   LOS: 1 day   Nayel Purdy R [facility] 10/22/2017, 7:58 AM

## 2017-10-22 NOTE — Anesthesia Preprocedure Evaluation (Addendum)
Anesthesia Evaluation  Patient identified by MRN, date of birth, ID band Patient awake    Reviewed: Allergy & Precautions, H&P , NPO status , Patient's Chart, lab work & pertinent test results, reviewed documented beta blocker date and time   History of Anesthesia Complications Negative for: history of anesthetic complications  Airway Mallampati: III  TM Distance: >3 FB Neck ROM: full    Dental  (+) Dental Advidsory Given, Missing   Pulmonary neg shortness of breath, neg COPD, neg recent URI, Current Smoker,           Cardiovascular Exercise Tolerance: Good negative cardio ROS       Neuro/Psych negative neurological ROS  negative psych ROS   GI/Hepatic negative GI ROS, Neg liver ROS,   Endo/Other  negative endocrine ROS  Renal/GU negative Renal ROS  negative genitourinary   Musculoskeletal   Abdominal   Peds  Hematology negative hematology ROS (+)   Anesthesia Other Findings History reviewed. No pertinent past medical history.   Reproductive/Obstetrics (+) Breast feeding                             Anesthesia Physical Anesthesia Plan  ASA: I  Anesthesia Plan: Spinal   Post-op Pain Management:    Induction:   PONV Risk Score and Plan: 1 and Midazolam and Ondansetron  Airway Management Planned:   Additional Equipment:   Intra-op Plan:   Post-operative Plan:   Informed Consent: I have reviewed the patients History and Physical, chart, labs and discussed the procedure including the risks, benefits and alternatives for the proposed anesthesia with the patient or authorized representative who has indicated his/her understanding and acceptance.   Dental Advisory Given  Plan Discussed with: Anesthesiologist, CRNA and Surgeon  Anesthesia Plan Comments:         Anesthesia Quick Evaluation

## 2017-10-22 NOTE — Progress Notes (Signed)
pt discharged home with infant.  Discharge instructions, prescriptions and follow up appointment given to and reviewed with pt.  Pt verbalized understanding, all questions answered.  Escorted by auxiliary. 

## 2017-10-22 NOTE — Anesthesia Procedure Notes (Signed)
Date/Time: 10/22/2017 9:35 AM Performed by: Henrietta Hoover, CRNA Pre-anesthesia Checklist: Patient identified, Emergency Drugs available, Suction available, Patient being monitored and Timeout performed Oxygen Delivery Method: Nasal cannula Placement Confirmation: positive ETCO2

## 2017-10-22 NOTE — Transfer of Care (Signed)
Immediate Anesthesia Transfer of Care Note  Patient: Whitney [name]  Procedure(s) Performed: POST PARTUM TUBAL LIGATION (Bilateral Abdomen)  Patient Location: PACU  Anesthesia Type:General  Level of Consciousness: awake  Airway & Oxygen Therapy: Patient Spontanous Breathing and Patient connected to nasal cannula oxygen  Post-op Assessment: Report given to RN and Post -op Vital signs reviewed and stable  Post vital signs: Reviewed and stable  Last Vitals:  Vitals:   10/22/17 0811 10/22/17 1044  BP: 105/70 96/65  Pulse: 87 85  Resp: 18 16  Temp: (!) 35.9 C (!) 36.2 C  SpO2: 97% 100%    Last Pain:  Vitals:   10/22/17 0811  TempSrc: Tympanic  PainSc: 0-No pain      Patients Stated Pain Goal: 0 (10/21/17 0805)  Complications: No apparent anesthesia complications

## 2017-10-22 NOTE — Op Note (Signed)
Operative Note  10/22/2017  PRE-OP DIAGNOSIS: Desire for sterility  POST-OP DIAGNOSIS: same  SURGEON: Adelene Idler, MD PROCEDURE: Postpartum left tubal ligation and right partial salpingectomy  ANESTHESIA: Spinal  ESTIMATED BLOOD LOSS: Minimal less than 10cc  SPECIMENS: Portion of left tube and  entire distal portion of right tube  COMPLICATIONS: None  DISPOSITION: Postpartum  CONDITION: stable  FINDINGS: Exam under anesthesia revealed small, mobile 20cm uterus with no masses and bilateral adnexa without masses or fullness.   TECHNIQUE:  Patient is prepped and draped in usual sterile fashion after adequate anesthesia is obtained in the supine position on the operating room table.  Local anesthesia is injected into the skin just inferior to the umbilicus, followed by a small elliptical incision with a scapel.  Fascia is identified and tented upwards, and an incision is made with Mayo scissors.  Identification of no adherent bowel is made. Retractors are placed and trendelenburg positioning is achieved.    The left Fallopian tube was identified, grasped with the Babcock clamps, lifted to the skin incision and followed out distally to the fimbriae. An avascular midsection of the tube approximately 3-4cm from the cornua was grasped with the babcock clamps and brought into a knuckle at the skin incision. The tube was double ligated with 0-0 Chromic suture and the intervening portion of tube was transected and removed. Excellent hemostasis was noted and the tube was returned to the abdomen. The patient was notified that this portion of the procedure was complete and she confirmed verbally she wanted to proceed with the sterilization procedure. Attention was then turned to the right fallopian tube. The right fallopian tube was followed out to the fimbriae. 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 Because of this the decision was made  intraoperatively to remove the entire tube. The patient had a spinal anesthetic and was able to give her verbal consent to this procedure. The proximal tube was suture ligated with 0-chromic. The tuboovarian vessels were then suture ligated by passing a 0-chromic suture through an avascular mesosalpinx. This was repeated a second time. A window was made in the mesosalpinx with the Bovie. The Mesosalpinx was clamped with a kocher and a pedicle was created with the Mayo scissors. The pedicle was tied with a Heaney stitch. The tube was elevated a second free tie of )-chromic was performed a the proximal portion of fallopian tube. The right tube was then excised with the [facility] and sent to pathology. All sites of ligation were viewed again and were hemostatic.   Excellent hemostasis was noted at the end of the procedure.  Retractors are removed and fascia closed with a 0-0 Vicryl suture. Irrigation and hemostasis confirmed.  Skin closed with a 4-0 monocryl suture in a subcuticular fashion followed by skin adhesive.  Pt goes to recovery room in stable fashion.  All counts correct times 2.

## 2017-10-22 NOTE — Progress Notes (Signed)
We have been closely monitoring the patient's urination today aftter she was straight cathed for 1450cc in PACU. She was on timed voids. She peed at least every 3 hours. She has had adequate urine output with small residuals on bladder scan. Most recently she voided 200cc with a bladder scan of 38cc. I was in the room for this. Because patient is voiding successfully we will discharge her home. Asked  Her to take the toilet hat with her an monitor her urination. Asked her to continue to void every three hours while awake and make sure that she is voiding adequate amounts between 200-500cc with each void. If she has any issues she is to call the office. Patient is in agreement with the plan.

## 2017-10-22 NOTE — Anesthesia Post-op Follow-up Note (Signed)
Anesthesia QCDR form completed.        

## 2017-10-22 NOTE — Interval H&P Note (Signed)
History and Physical Interval Note:  10/22/2017 8:02 AM  Whitney [name]  has presented today for surgery, with the diagnosis of N/A  The various methods of treatment have been discussed with the patient and family. After consideration of risks, benefits and other options for treatment, the patient has consented to  Procedure(s): POST PARTUM TUBAL LIGATION (Bilateral) as a surgical intervention .  The patient's history has been reviewed, patient examined, no change in status, stable for surgery.  I have reviewed the patient's chart and labs.  Questions were answered to the patient's satisfaction.     Whitney [name] R Tariq Pernell

## 2017-10-22 NOTE — Anesthesia Procedure Notes (Signed)
Spinal  Patient location during procedure: OR Start time: 10/22/2017 9:19 AM End time: 10/22/2017 9:33 AM Staffing Anesthesiologist: Martha Clan, MD Resident/CRNA: Allean Found, CRNA Performed: resident/CRNA  Preanesthetic Checklist Completed: patient identified, site marked, surgical consent, pre-op evaluation, timeout performed, IV checked, risks and benefits discussed and monitors and equipment checked Spinal Block Patient position: sitting Prep: ChloraPrep Patient monitoring: heart rate, continuous pulse ox, blood pressure and cardiac monitor Approach: midline Location: L3-4 Injection technique: single-shot Needle Needle type: Whitacre and Introducer  Needle gauge: 24 G Needle length: 9 cm Assessment Sensory level: T10 Additional Notes Negative paresthesia. Negative blood return. Positive free-flowing CSF. Expiration date of kit checked and confirmed. Patient tolerated procedure well, without complications.

## 2017-10-24 ENCOUNTER — Encounter: Payer: Self-pay | Admitting: Obstetrics and Gynecology

## 2017-10-25 LAB — SURGICAL PATHOLOGY

## 2017-10-25 NOTE — Anesthesia Postprocedure Evaluation (Signed)
Anesthesia Post Note  Patient: Whitney [name]  Procedure(s) Performed: POST PARTUM TUBAL LIGATION (Bilateral Abdomen)  Patient location during evaluation: PACU Anesthesia Type: Spinal Level of consciousness: awake and alert Pain management: pain level controlled Vital Signs Assessment: post-procedure vital signs reviewed and stable Respiratory status: spontaneous breathing and respiratory function stable Cardiovascular status: blood pressure returned to baseline and stable Postop Assessment: spinal receding Anesthetic complications: no     Last Vitals:  Vitals:   10/22/17 1700 10/22/17 1958  BP: (!) 112/59 (!) 90/49  Pulse: 87 91  Resp:  18  Temp: 36.5 C 36.8 C  SpO2: 98% 98%    Last Pain:  Vitals:   10/22/17 2038  TempSrc:   PainSc: 1                  Lenard Simmer

## 2017-10-27 ENCOUNTER — Encounter: Payer: Medicaid Other | Admitting: Obstetrics and Gynecology

## 2017-11-24 ENCOUNTER — Encounter: Payer: Self-pay | Admitting: Advanced Practice Midwife

## 2017-11-24 ENCOUNTER — Ambulatory Visit (INDEPENDENT_AMBULATORY_CARE_PROVIDER_SITE_OTHER): Payer: Medicaid Other | Admitting: Advanced Practice Midwife

## 2017-11-24 NOTE — Progress Notes (Signed)
Postpartum Visit  Chief Complaint:  Chief Complaint  Patient presents with  . 6 week post partum    History of Present Illness: Patient is a 32 y.o. [id number] presents for postpartum visit.   Review the Delivery Report for details.  Date of delivery: 10/21/2017 Type of delivery: Vaginal delivery - Vacuum or forceps assisted  no Episiotomy No.  Laceration: small first degree not repaired  Pregnancy or labor problems:  no Any problems since the delivery:  No. She has quit smoking since delivery  Newborn Details:  SINGLETON :  1. BabyGender: female. Birth weight: 3345 g Maternal Details:  Breast Feeding:  yes Post partum depression/anxiety noted:  no Edinburgh Post-Partum Depression Score:  3  Date of last PAP: 03/17/2017  normal   Review of Systems: Review of Systems  Constitutional: Negative.   HENT: Negative.   Eyes: Negative.   Respiratory: Negative.   Cardiovascular: Negative.   Gastrointestinal: Negative.   Genitourinary: Negative.   Musculoskeletal: Negative.   Skin: Negative.   Neurological: Negative.   Endo/Heme/Allergies: Negative.   Psychiatric/Behavioral: Negative.     The following portions of the patient's history were reviewed and updated as appropriate: allergies, current medications, past family history, past medical history, past social history, past surgical history and problem list.   Past Medical History:  History reviewed. No pertinent past medical history.  Past Surgical History:  Past Surgical History:  Procedure Laterality Date  . DILATION AND CURETTAGE OF UTERUS    . TUBAL LIGATION Bilateral 10/22/2017   Procedure: POST PARTUM TUBAL LIGATION;  Surgeon: Natale Milch, MD;  Location: [facility] ORS;  Service: Gynecology;  Laterality: Bilateral;    Family History:  Family History  Problem Relation Age of Onset  . Gout Father   . Hyperlipidemia Father   . Diabetes Other   . Cancer Other     Social History:  Social History    Socioeconomic History  . Marital status: Single    Spouse name: Not on file  . Number of children: Not on file  . Years of education: Not on file  . Highest education level: Not on file  Social Needs  . Financial resource strain: Not on file  . Food insecurity - worry: Not on file  . Food insecurity - inability: Not on file  . Transportation needs - medical: Not on file  . Transportation needs - non-medical: Not on file  Occupational History  . Not on file  Tobacco Use  . Smoking status: Former Games developer  . Smokeless tobacco: Never Used  Substance and Sexual Activity  . Alcohol use: No    Comment: occasional  . Drug use: No  . Sexual activity: Yes    Birth control/protection: BTL  Other Topics Concern  . Not on file  Social History Narrative  . Not on file    Allergies:  No Known Allergies  Medications: Prior to Admission medications   Medication Sig Start Date End Date Taking? Authorizing Provider  acetaminophen (TYLENOL) 500 MG tablet Take 1,000 mg by mouth every 6 (six) hours as needed for moderate pain.   Yes [provider]  Prenatal Vit-Fe Fumarate-FA (MULTIVITAMIN-PRENATAL) 27-0.8 MG TABS tablet Take 1 tablet by mouth daily at 12 noon.    [provider]    Physical Exam Vitals:  Vitals:   11/24/17 1427  BP: 118/78    General: NAD HEENT: normocephalic, anicteric Pulmonary: No increased work of breathing Abdomen: NABS, soft, non-tender, non-distended.  Umbilicus  without lesions.  No hepatomegaly, splenomegaly or masses palpable. No evidence of hernia. Genitourinary:  External: Normal external female genitalia.  Normal urethral meatus, normal  Bartholin's and Skene's glands.    Vagina: Normal vaginal mucosa, no evidence of prolapse.    Cervix: Grossly normal in appearance, no bleeding, no CMT  Uterus: Non-enlarged, mobile, normal contour.    Adnexa: ovaries non-enlarged, no adnexal masses  Rectal: deferred Extremities: no edema,  erythema, or tenderness Neurologic: Grossly intact Psychiatric: mood appropriate, affect full  Assessment: 32 y.o. Z6X0960 presenting for 6 week postpartum visit  Plan: Problem List Items Addressed This Visit    None       1) Contraception: Bilateral Tubal Ligation  2) Continue breastfeeding  3) Encouraged to continue with cessation from smoking  4)  Pap - ASCCP guidelines and rational discussed.  Patient opts for every 2-3 year screening interval  5) Patient underwent screening for postpartum depression with no concerns noted.  6) Follow up 1 year for routine annual exam  Tresea Mall, CNM

## 2017-12-02 ENCOUNTER — Telehealth: Payer: Self-pay

## 2017-12-02 ENCOUNTER — Encounter: Payer: Self-pay | Admitting: Obstetrics & Gynecology

## 2017-12-02 ENCOUNTER — Ambulatory Visit (INDEPENDENT_AMBULATORY_CARE_PROVIDER_SITE_OTHER): Payer: Medicaid Other | Admitting: Obstetrics & Gynecology

## 2017-12-02 VITALS — BP 100/60 | Ht 70.5 in | Wt 157.0 lb

## 2017-12-02 DIAGNOSIS — K429 Umbilical hernia without obstruction or gangrene: Secondary | ICD-10-CM | POA: Diagnosis not present

## 2017-12-02 DIAGNOSIS — R1033 Periumbilical pain: Secondary | ICD-10-CM

## 2017-12-02 DIAGNOSIS — T8149XA Infection following a procedure, other surgical site, initial encounter: Secondary | ICD-10-CM | POA: Insufficient documentation

## 2017-12-02 NOTE — Telephone Encounter (Signed)
PT called triage line stating she had a vaginal delivery, but had a tubal ligation by Dr.Schuman. Her incision has become red/hot/ and very sore.   Per Sunny Schlein, pt can be seen by [facility] today at 10:20 since CS is not in office. PT aware and will be here

## 2017-12-02 NOTE — Progress Notes (Signed)
  Abdominal Pain Patient presents for evaluation of abdominal pain. The pain is described as pressure-like, and is 6/10 in intensity. Pain is located in the periumbilical area without radiation. Onset was gradual occurring a few days ago. Symptoms have been unchanged since. Aggravating factors: bowel movement and lifting. Alleviating factors: none. Associated symptoms: yesterday felt like skin was red, today it is not. The patient denies chills, constipation, diarrhea, fever and vomiting. Risk factors for pelvic/abdominal pain include surgery for PP BTL 5 weeks ago.  PMHx: She  has no past medical history on file. Also,  has a past surgical history that includes Dilation and curettage of uterus and Tubal ligation (Bilateral, 10/22/2017)., family history includes Cancer in her other; Diabetes in her other; Gout in her father; Hyperlipidemia in her father.,  reports that she has quit smoking. she has never used smokeless tobacco. She reports that she does not drink alcohol or use drugs.  She has a current medication list which includes the following prescription(s): acetaminophen and multivitamin-prenatal. Also, has No Known Allergies.  Review of Systems  Constitutional: Negative for chills, fever and malaise/fatigue.  HENT: Negative for congestion, sinus pain and sore throat.   Eyes: Negative for blurred vision and pain.  Respiratory: Negative for cough and wheezing.   Cardiovascular: Negative for chest pain and leg swelling.  Gastrointestinal: Negative for abdominal pain, constipation, diarrhea, heartburn, nausea and vomiting.  Genitourinary: Negative for dysuria, frequency, hematuria and urgency.  Musculoskeletal: Negative for back pain, joint pain, myalgias and neck pain.  Skin: Negative for itching and rash.  Neurological: Negative for dizziness, tremors and weakness.  Endo/Heme/Allergies: Does not bruise/bleed easily.  Psychiatric/Behavioral: Negative for depression. The patient is not  nervous/anxious and does not have insomnia.    Objective: BP 100/60   Ht 5' 10.5" (1.791 m)   Wt 157 lb (71.2 kg)   BMI 22.21 kg/m  Physical Exam  Constitutional: She is oriented to person, place, and time. She appears well-developed and well-nourished. No distress.  Abdominal: Normal appearance and bowel sounds are normal. She exhibits no distension. There is tenderness in the periumbilical area. There is no rebound, no guarding, no CVA tenderness, no tenderness at McBurney's point and negative Murphy's sign. A hernia is present.  Umb hernia vs scar tissue/suture granuloma at umbilicus No rash or erythema Inc well healed  Musculoskeletal: Normal range of motion.  Neurological: She is alert and oriented to person, place, and time.  Skin: Skin is warm and dry.  Psychiatric: She has a normal mood and affect.  Vitals reviewed.  ASSESSMENT/PLAN:   Problem List Items Addressed This Visit      Other   Umbilical pain    Other Visit Diagnoses    Umbilical hernia without obstruction and without gangrene    -  Primary    May have PP umbilical hernia, and as sx's and exam mild will monitor for spon resolution. Gen Surg referral if persists or worsens Monitor for skin redness or rash as well  Annamarie Major, MD, [facility], [facility] 12/02/2017  10:32 AM

## 2020-03-21 ENCOUNTER — Encounter (HOSPITAL_COMMUNITY): Payer: Self-pay | Admitting: Emergency Medicine

## 2020-03-21 ENCOUNTER — Emergency Department (HOSPITAL_COMMUNITY)
Admission: EM | Admit: 2020-03-21 | Discharge: 2020-03-21 | Disposition: A | Discharge status: Home / Self Care | Payer: Medicaid Other | Attending: Emergency Medicine | Admitting: Emergency Medicine

## 2020-03-21 ENCOUNTER — Emergency Department (HOSPITAL_COMMUNITY): Payer: Medicaid Other

## 2020-03-21 ENCOUNTER — Other Ambulatory Visit: Payer: Self-pay

## 2020-03-21 DIAGNOSIS — Z79899 Other long term (current) drug therapy: Secondary | ICD-10-CM | POA: Insufficient documentation

## 2020-03-21 DIAGNOSIS — Z87891 Personal history of nicotine dependence: Secondary | ICD-10-CM | POA: Diagnosis not present

## 2020-03-21 DIAGNOSIS — G43109 Migraine with aura, not intractable, without status migrainosus: Secondary | ICD-10-CM | POA: Insufficient documentation

## 2020-03-21 DIAGNOSIS — R4182 Altered mental status, unspecified: Secondary | ICD-10-CM | POA: Diagnosis present

## 2020-03-21 HISTORY — DX: Migraine, unspecified, not intractable, without status migrainosus: G43.909

## 2020-03-21 HISTORY — DX: Arteriovenous malformation, site unspecified: Q27.30

## 2020-03-21 LAB — BASIC METABOLIC PANEL
Anion gap: 7 (ref 5–15)
BUN: 12 mg/dL (ref 6–20)
CO2: 24 mmol/L (ref 22–32)
Calcium: 8.9 mg/dL (ref 8.9–10.3)
Chloride: 108 mmol/L (ref 98–111)
Creatinine, Ser: 0.79 mg/dL (ref 0.44–1.00)
GFR calc Af Amer: >60 mL/min (ref >60)
GFR calc non Af Amer: >60 mL/min (ref >60)
Glucose, Bld: 120 mg/dL — ABNORMAL HIGH (ref 70–99)
Potassium: 3.3 mmol/L — ABNORMAL LOW (ref 3.5–5.1)
Sodium: 139 mmol/L (ref 135–145)

## 2020-03-21 LAB — CBC WITH DIFFERENTIAL/PLATELET
Abs Immature Granulocytes: 0.02 10*3/uL (ref 0.00–0.07)
Basophils Absolute: 0.1 10*3/uL (ref 0.0–0.1)
Basophils Relative: 1 %
Eosinophils Absolute: 0.2 10*3/uL (ref 0.0–0.5)
Eosinophils Relative: 2 %
HCT: 36.3 % (ref 36.0–46.0)
Hemoglobin: 11.8 g/dL — ABNORMAL LOW (ref 12.0–15.0)
Immature Granulocytes: 0 %
Lymphocytes Relative: 25 %
Lymphs Abs: 2.1 10*3/uL (ref 0.7–4.0)
MCH: 32 pg (ref 26.0–34.0)
MCHC: 32.5 g/dL (ref 30.0–36.0)
MCV: 98.4 fL (ref 80.0–100.0)
Monocytes Absolute: 0.7 10*3/uL (ref 0.1–1.0)
Monocytes Relative: 8 %
Neutro Abs: 5.5 10*3/uL (ref 1.7–7.7)
Neutrophils Relative %: 64 %
Platelets: 298 10*3/uL (ref 150–400)
RBC: 3.69 MIL/uL — ABNORMAL LOW (ref 3.87–5.11)
RDW: 13.2 % (ref 11.5–15.5)
WBC: 8.5 10*3/uL (ref 4.0–10.5)
nRBC: 0 % (ref 0.0–0.2)

## 2020-03-21 LAB — ETHANOL: Alcohol, Ethyl (B): <10 mg/dL (ref <10)

## 2020-03-21 LAB — HCG, QUANTITATIVE, PREGNANCY: hCG, Beta Chain, Quant, S: <1 m[IU]/mL (ref <5)

## 2020-03-21 IMAGING — CT CT HEAD W/O CM
3 series · 15 of 47 positions shown, 18 images · non-contrast
Comparison: CT head and maxillofacial [DATE]

CLINICAL DATA: Sudden onset altered mental status.

EXAM:
CT HEAD WITHOUT CONTRAST
TECHNIQUE: Contiguous axial images were obtained from the base of the skull
through the vertex without intravenous contrast.

[Series 2: head w o · axial · 0.46mm/px · z∈[+1650,+1780]mm · 9 of 32 slices shown, 12 images]
[im 3/32  brain]
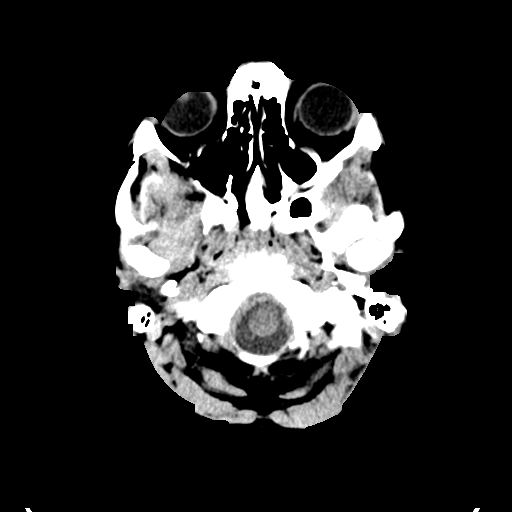
[im 3/32  bone]
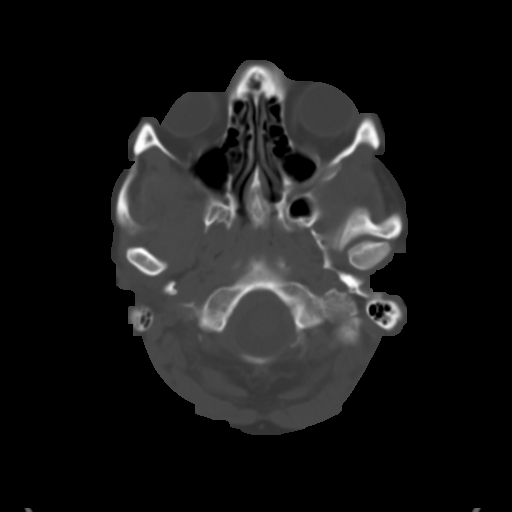
[im 6/32  brain]
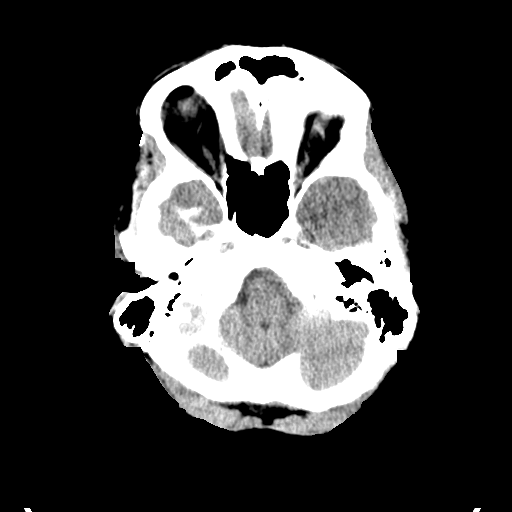
[im 9/32  brain]
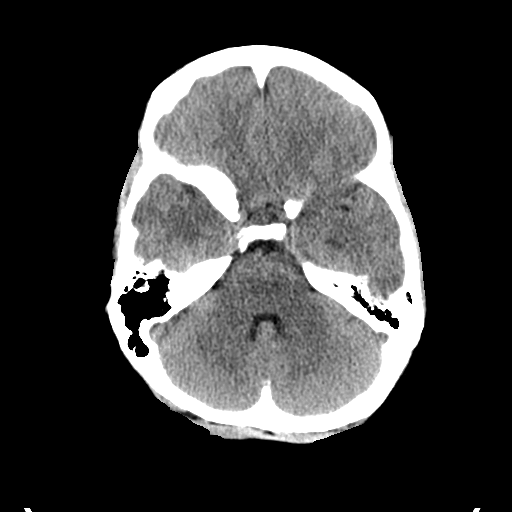
[im 12/32  brain]
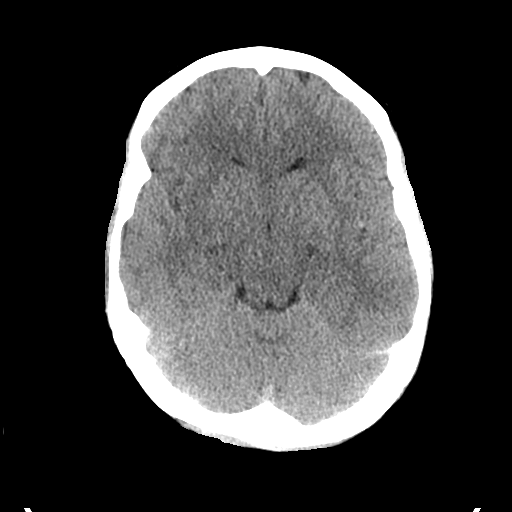
[im 17/32  brain]
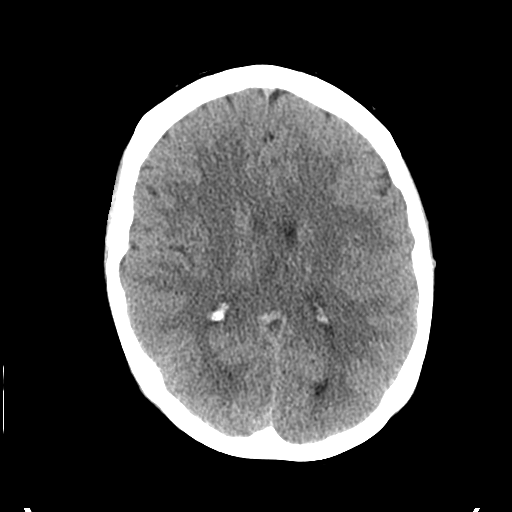
[im 17/32  bone]
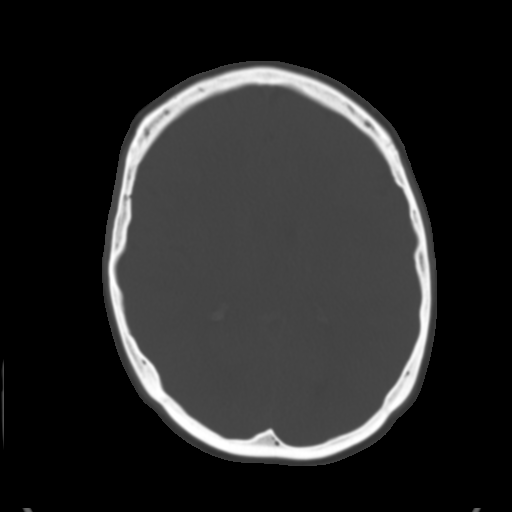
[im 20/32  brain]
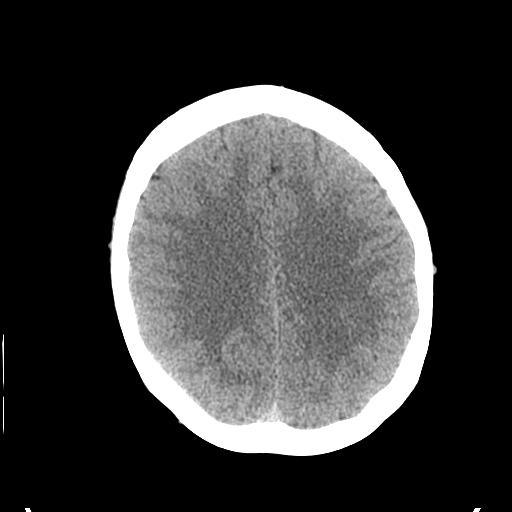
[im 23/32  brain]
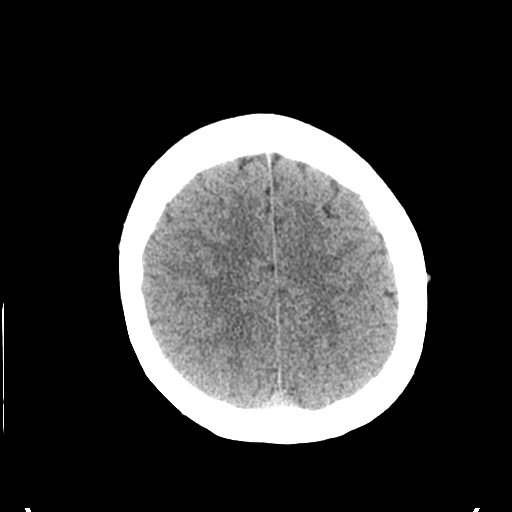
[im 26/32  brain]
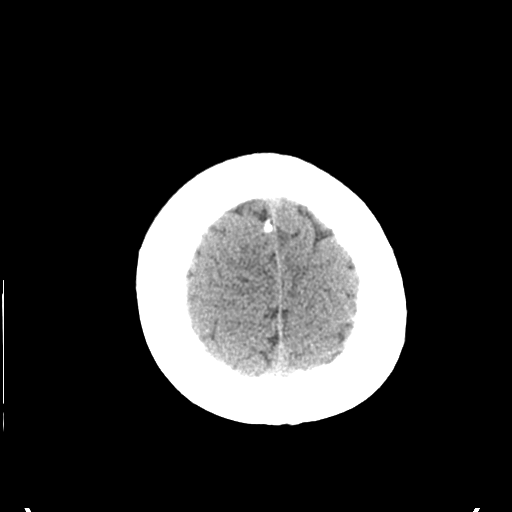
[im 29/32  brain]
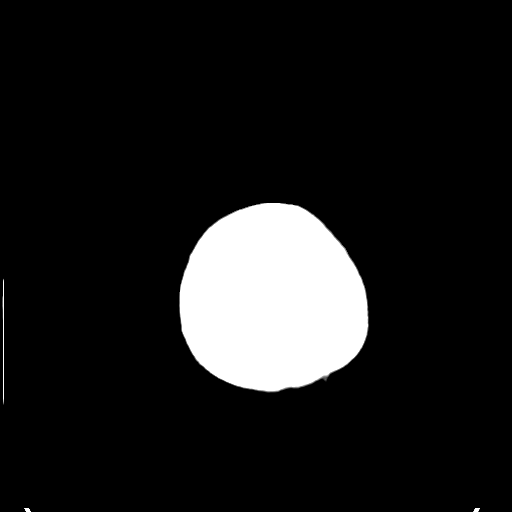
[im 29/32  bone]
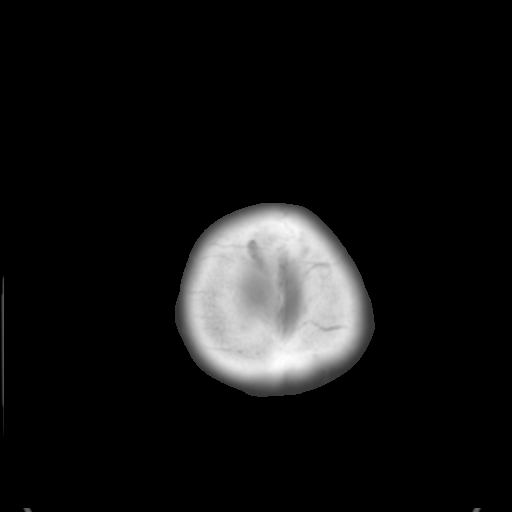

[Series 4: coronal soft · coronal · 0.33mm/px · 3 of 68 slices shown]
[im 23/68  brain]
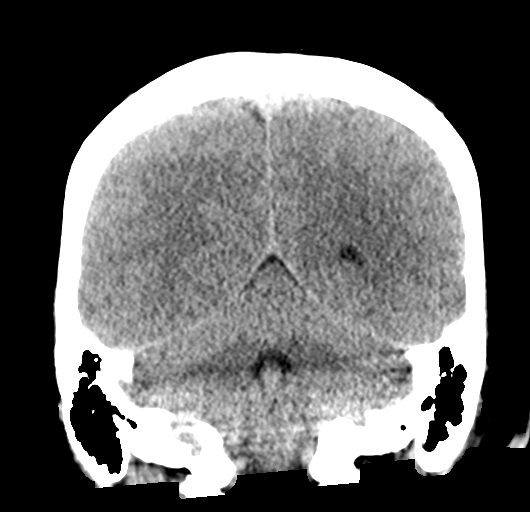
[im 30/68  brain]
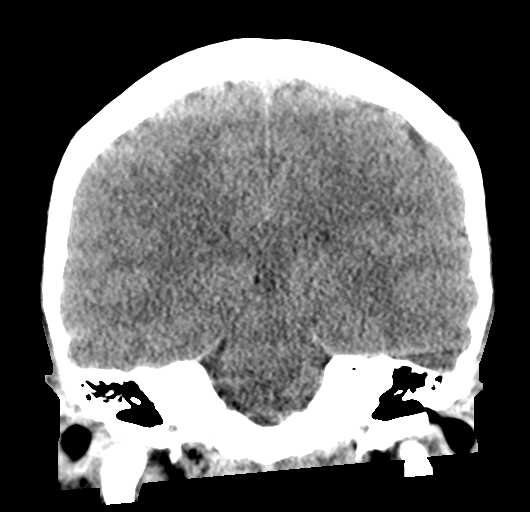
[im 38/68  brain]
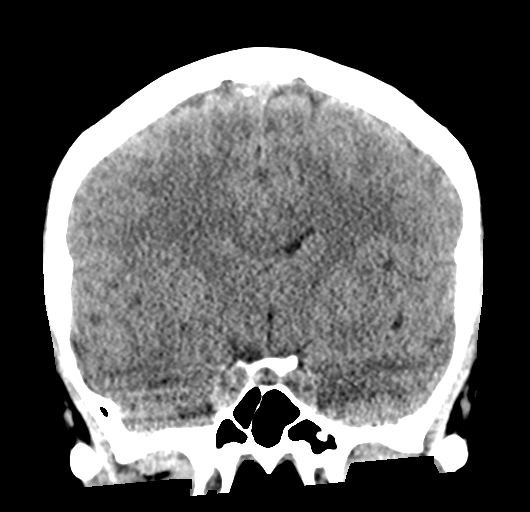

[Series 5: sagittal soft · sagittal · 0.32mm/px · 3 of 57 slices shown]
[im 19/57  brain]
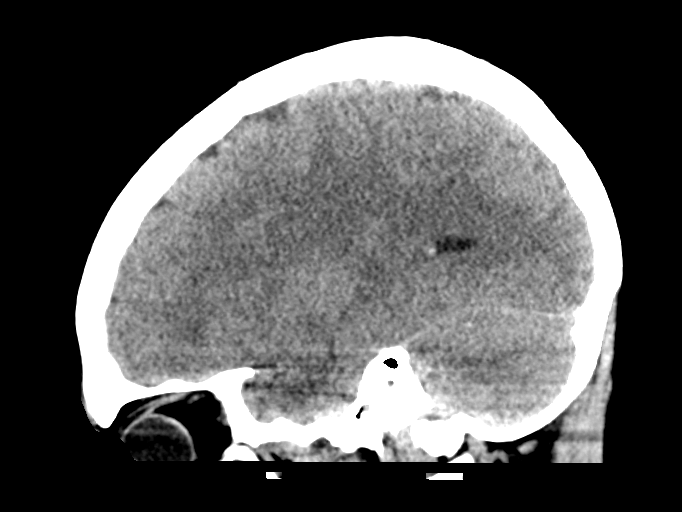
[im 29/57  brain]
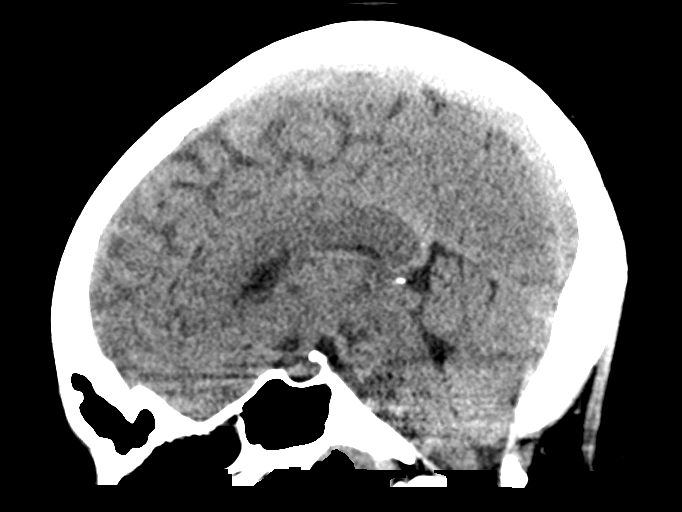
[im 38/57  brain]
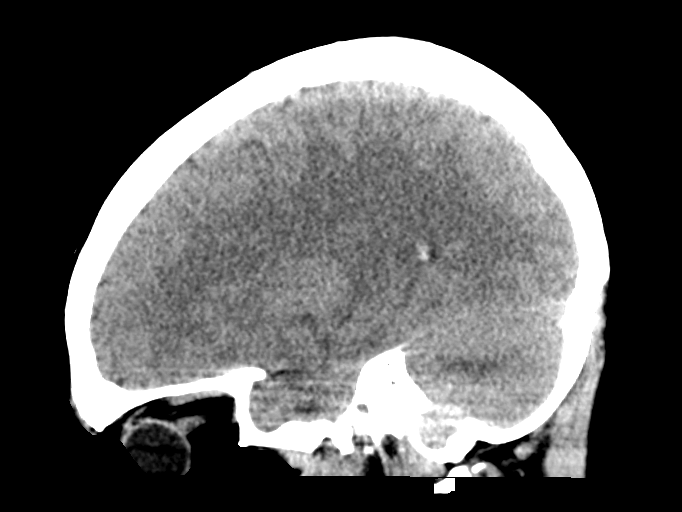

[15 of 47 positions shown; findings below may reference images not displayed]

FINDINGS: Brain: Stable parafalcine calcification in the right frontal vertex
([DATE]) could reflect dural calcification versus calcified meningioma
unchanged since [LY]. No evidence of acute infarction, hemorrhage,
hydrocephalus, extra-axial collection or mass lesion/mass effect.
Midline structures are unremarkable. Cerebellar tonsils are normally
positioned.

Vascular: No hyperdense vessel or unexpected calcification.

Skull: No calvarial fracture or suspicious osseous lesion. No scalp
swelling or hematoma.

Sinuses/Orbits: Mild circumferential mural thickening in the left
maxillary sinus. Remaining paranasal sinuses and mastoids are
predominantly clear. Orbits are unremarkable.

Other: None
IMPRESSION: 1. No acute intracranial abnormality.
2. Stable parafalcine calcification in the right frontal vertex
could reflect dural calcification versus calcified meningioma
unchanged since [DATE].

## 2020-03-21 MED ORDER — DIPHENHYDRAMINE HCL 50 MG/ML IJ SOLN
25.0000 mg | Freq: Once | INTRAMUSCULAR | Status: AC
  Administered 2020-03-21: 25 mg via INTRAVENOUS
  Filled 2020-03-21: qty 1

## 2020-03-21 MED ORDER — KETOROLAC TROMETHAMINE 30 MG/ML IJ SOLN
30.0000 mg | Freq: Once | INTRAMUSCULAR | Status: AC
  Administered 2020-03-21: 30 mg via INTRAVENOUS
  Filled 2020-03-21: qty 1

## 2020-03-21 MED ORDER — SODIUM CHLORIDE 0.9 % IV BOLUS
1000.0000 mL | Freq: Once | INTRAVENOUS | Status: AC
  Administered 2020-03-21: 1000 mL via INTRAVENOUS

## 2020-03-21 MED ORDER — PROCHLORPERAZINE EDISYLATE 10 MG/2ML IJ SOLN
10.0000 mg | Freq: Once | INTRAMUSCULAR | Status: AC
  Administered 2020-03-21: 10 mg via INTRAVENOUS
  Filled 2020-03-21: qty 2

## 2020-03-21 NOTE — Discharge Instructions (Signed)
Your symptoms today might have been secondary to a migraine, however I cannot rule out seizure.  You should not drive until you follow-up with a neurologist.  Call one of the listed groups to schedule a follow-up.  Return to the ER for any recurrent episodes.

## 2020-03-21 NOTE — ED Triage Notes (Signed)
Boyfriend called EMS after pt had sudden onset of AMS. Per EMS, when they arrived on scene; pt was very disoriented and could not even say her name. Upon arrival to ED, pt was A&O x 4. Blood glucose was 120 per EMS.

## 2020-03-21 NOTE — ED Provider Notes (Signed)
[facility] EMERGENCY DEPARTMENT Provider Note   CSN: [id number] Arrival date & time: 03/21/20  0005     History Chief Complaint  Patient presents with  . Altered Mental Status    Whitney [name] is a 34 y.o. female.  Patient presents to the emergency department for evaluation of altered mental status.  Patient's boyfriend called EMS because she became acutely confused.  EMS report that when they arrived on the scene that she was completely disoriented.  During transport, however, patient has returned to her baseline.  She remembers not being able to answer questions earlier but now has no difficulty.  She complains of visual disturbance and headache consistent with her recurrent migraines.  She has never had mental status changes associated with migraines before.        Past Medical History:  Diagnosis Date  . AVM (arteriovenous malformation)   . Migraines     Patient Active Problem List   Diagnosis Date Noted  . Inflammation of operative incision 12/02/2017  . Umbilical pain 12/02/2017  . Request for sterilization 10/22/2017  . Postpartum care following vaginal delivery 10/21/2017    Past Surgical History:  Procedure Laterality Date  . DILATION AND CURETTAGE OF UTERUS    . TUBAL LIGATION Bilateral 10/22/2017   Procedure: POST PARTUM TUBAL LIGATION;  Surgeon: Natale Milch, MD;  Location: [facility] ORS;  Service: Gynecology;  Laterality: Bilateral;     OB History    Gravida  4   Para  3   Term  3   Preterm      AB  1   Living  3     SAB  1   TAB      Ectopic      Multiple  0   Live Births  3           Family History  Problem Relation Age of Onset  . Gout Father   . Hyperlipidemia Father   . Diabetes Other   . Cancer Other     Social History   Tobacco Use  . Smoking status: Former Games developer  . Smokeless tobacco: Never Used  Substance Use Topics  . Alcohol use: No    Comment: occasional  . Drug use: No    Home  Medications Prior to Admission medications   Medication Sig Start Date End Date Taking? Authorizing Provider  acetaminophen (TYLENOL) 500 MG tablet Take 1,000 mg by mouth every 6 (six) hours as needed for moderate pain.    [provider]  Prenatal Vit-Fe Fumarate-FA (MULTIVITAMIN-PRENATAL) 27-0.8 MG TABS tablet Take 1 tablet by mouth daily at 12 noon.    [provider]    Allergies    Patient has no known allergies.  Review of Systems   Review of Systems  Eyes: Positive for photophobia and visual disturbance.  Neurological: Positive for headaches.  All other systems reviewed and are negative.   Physical Exam Updated Vital Signs BP 112/81   Pulse 85   Temp 98.2 F (36.8 C)   Resp 18   Ht 5\' 11"  (1.803 m)   Wt 76.2 kg   LMP  (LMP Unknown)   SpO2 97%   BMI 23.43 kg/m   Physical Exam Vitals and nursing note reviewed.  Constitutional:      General: She is not in acute distress.    Appearance: Normal appearance. She is well-developed.  HENT:     Head: Normocephalic and atraumatic.     Right Ear: Hearing normal.  Left Ear: Hearing normal.     Nose: Nose normal.  Eyes:     Conjunctiva/sclera: Conjunctivae normal.     Pupils: Pupils are equal, round, and reactive to light.  Cardiovascular:     Rate and Rhythm: Regular rhythm.     Heart sounds: S1 normal and S2 normal. No murmur. No friction rub. No gallop.   Pulmonary:     Effort: Pulmonary effort is normal. No respiratory distress.     Breath sounds: Normal breath sounds.  Chest:     Chest wall: No tenderness.  Abdominal:     General: Bowel sounds are normal.     Palpations: Abdomen is soft.     Tenderness: There is no abdominal tenderness. There is no guarding or rebound. Negative signs include Murphy's sign and McBurney's sign.     Hernia: No hernia is present.  Musculoskeletal:        General: Normal range of motion.     Cervical back: Normal range of motion and neck supple.  Skin:     General: Skin is warm and dry.     Findings: No rash.  Neurological:     Mental Status: She is alert and oriented to person, place, and time.     GCS: GCS eye subscore is 4. GCS verbal subscore is 5. GCS motor subscore is 6.     Cranial Nerves: No cranial nerve deficit.     Sensory: No sensory deficit.     Coordination: Coordination normal.  Psychiatric:        Speech: Speech normal.        Behavior: Behavior normal.        Thought Content: Thought content normal.     ED Results / Procedures / Treatments   Labs (all labs ordered are listed, but only abnormal results are displayed) Labs Reviewed  CBC WITH DIFFERENTIAL/PLATELET - Abnormal; Notable for the following components:      Result Value   RBC 3.69 (*)    Hemoglobin 11.8 (*)    All other components within normal limits  BASIC METABOLIC PANEL - Abnormal; Notable for the following components:   Potassium 3.3 (*)    Glucose, Bld 120 (*)    All other components within normal limits  ETHANOL  RAPID URINE DRUG SCREEN, HOSP PERFORMED  HCG, QUANTITATIVE, PREGNANCY    EKG None  Radiology CT HEAD WO CONTRAST  Result Date: 03/21/2020 CLINICAL DATA:  Sudden onset altered mental status. EXAM: CT HEAD WITHOUT CONTRAST TECHNIQUE: Contiguous axial images were obtained from the base of the skull through the vertex without intravenous contrast. COMPARISON:  CT head and maxillofacial 06/07/2013 FINDINGS: Brain: Stable parafalcine calcification in the right frontal vertex (2/27) could reflect dural calcification versus calcified meningioma unchanged since 2014. No evidence of acute infarction, hemorrhage, hydrocephalus, extra-axial collection or mass lesion/mass effect. Midline structures are unremarkable. Cerebellar tonsils are normally positioned. Vascular: No hyperdense vessel or unexpected calcification. Skull: No calvarial fracture or suspicious osseous lesion. No scalp swelling or hematoma. Sinuses/Orbits: Mild circumferential mural  thickening in the left maxillary sinus. Remaining paranasal sinuses and mastoids are predominantly clear. Orbits are unremarkable. Other: None IMPRESSION: 1. No acute intracranial abnormality. 2. Stable parafalcine calcification in the right frontal vertex could reflect dural calcification versus calcified meningioma unchanged since 2014. Electronically Signed   By: Lovena Le M.D.   On: 03/21/2020 01:23    Procedures Procedures (including critical care time)  Medications Ordered in ED Medications  sodium chloride 0.9 % bolus  1,000 mL (1,000 mLs Intravenous New Bag/Given 03/21/20 0040)  ketorolac (TORADOL) 30 MG/ML injection 30 mg (30 mg Intravenous Given 03/21/20 0039)  prochlorperazine (COMPAZINE) injection 10 mg (10 mg Intravenous Given 03/21/20 0038)  diphenhydrAMINE (BENADRYL) injection 25 mg (25 mg Intravenous Given 03/21/20 0037)    ED Course  I have reviewed the triage vital signs and the nursing notes.  Pertinent labs & imaging results that were available during my care of the patient were reviewed by me and considered in my medical decision making (see chart for details).    MDM Rules/Calculators/A&P                      Patient presents to the emergency department for evaluation of altered mental status.  EMS report that the patient was very confused upon their arrival but by the time she arrived in the ER she is back to her normal baseline, now complaining of typical migraine features.  Further information provided from significant other.  He reports that she did have some shaking of her right arm and leg and then tapping of her chest with her right arm prior to onset of confusion.  This raises concern for possible focal seizure.  She does have a history of frequent migraines.  Head CT does not show any changes today.  She was given migraine treatment and her headache is now completely resolved, vision back to normal.  Suspect that this was all related to migraine but will advise her  not to drive and will refer to neurology for outpatient follow-up.  Final Clinical Impression(s) / ED Diagnoses Final diagnoses:  Migraine with aura and without status migrainosus, not intractable    Rx / DC Orders ED Discharge Orders    None       Aris Moman, Canary Brim, MD 03/21/20 [phone]

## 2020-11-21 ENCOUNTER — Emergency Department (HOSPITAL_COMMUNITY): Payer: Medicaid Other

## 2020-11-21 ENCOUNTER — Encounter (HOSPITAL_COMMUNITY): Payer: Self-pay | Admitting: Emergency Medicine

## 2020-11-21 ENCOUNTER — Emergency Department (HOSPITAL_COMMUNITY)
Admission: EM | Admit: 2020-11-21 | Discharge: 2020-11-21 | Disposition: A | Discharge status: Home / Self Care | Payer: Medicaid Other | Attending: Emergency Medicine | Admitting: Emergency Medicine

## 2020-11-21 ENCOUNTER — Other Ambulatory Visit: Payer: Self-pay

## 2020-11-21 DIAGNOSIS — Z87891 Personal history of nicotine dependence: Secondary | ICD-10-CM | POA: Diagnosis not present

## 2020-11-21 DIAGNOSIS — G40009 Localization-related (focal) (partial) idiopathic epilepsy and epileptic syndromes with seizures of localized onset, not intractable, without status epilepticus: Secondary | ICD-10-CM | POA: Diagnosis present

## 2020-11-21 DIAGNOSIS — Z79899 Other long term (current) drug therapy: Secondary | ICD-10-CM | POA: Diagnosis not present

## 2020-11-21 DIAGNOSIS — R569 Unspecified convulsions: Secondary | ICD-10-CM

## 2020-11-21 LAB — BASIC METABOLIC PANEL
Anion gap: 8 (ref 5–15)
BUN: 13 mg/dL (ref 6–20)
CO2: 24 mmol/L (ref 22–32)
Calcium: 9.5 mg/dL (ref 8.9–10.3)
Chloride: 103 mmol/L (ref 98–111)
Creatinine, Ser: 0.79 mg/dL (ref 0.44–1.00)
GFR, Estimated: >60 mL/min (ref >60)
Glucose, Bld: 68 mg/dL — ABNORMAL LOW (ref 70–99)
Potassium: 3.5 mmol/L (ref 3.5–5.1)
Sodium: 135 mmol/L (ref 135–145)

## 2020-11-21 LAB — URINALYSIS, ROUTINE W REFLEX MICROSCOPIC
Bacteria, UA: NONE SEEN
Bilirubin Urine: NEGATIVE
Glucose, UA: NEGATIVE mg/dL
Ketones, ur: NEGATIVE mg/dL
Leukocytes,Ua: NEGATIVE
Nitrite: NEGATIVE
Protein, ur: NEGATIVE mg/dL
Specific Gravity, Urine: 1.011 (ref 1.005–1.030)
pH: 6 (ref 5.0–8.0)

## 2020-11-21 LAB — CBC
HCT: 41.2 % (ref 36.0–46.0)
Hemoglobin: 13.8 g/dL (ref 12.0–15.0)
MCH: 32.5 pg (ref 26.0–34.0)
MCHC: 33.5 g/dL (ref 30.0–36.0)
MCV: 96.9 fL (ref 80.0–100.0)
Platelets: 247 10*3/uL (ref 150–400)
RBC: 4.25 MIL/uL (ref 3.87–5.11)
RDW: 12.9 % (ref 11.5–15.5)
WBC: 8.8 10*3/uL (ref 4.0–10.5)
nRBC: 0 % (ref 0.0–0.2)

## 2020-11-21 LAB — PREGNANCY, URINE: Preg Test, Ur: NEGATIVE

## 2020-11-21 IMAGING — CT CT HEAD W/O CM
3 series · 16 of 47 positions shown, 19 images · non-contrast
Comparison: Prior head CT examinations [DATE] and earlier.

CLINICAL DATA: Neuro deficit, acute, stroke suspected. Additional
history provided: Patient reports history of seizures, feels as
though she has had a seizure today.

EXAM:
CT HEAD WITHOUT CONTRAST
TECHNIQUE: Contiguous axial images were obtained from the base of the skull
through the vertex without intravenous contrast.

[Series 2: head w o · axial · 0.40mm/px · z∈[+33,+163]mm · 10 of 32 slices shown, 13 images]
[im 3/32  brain]
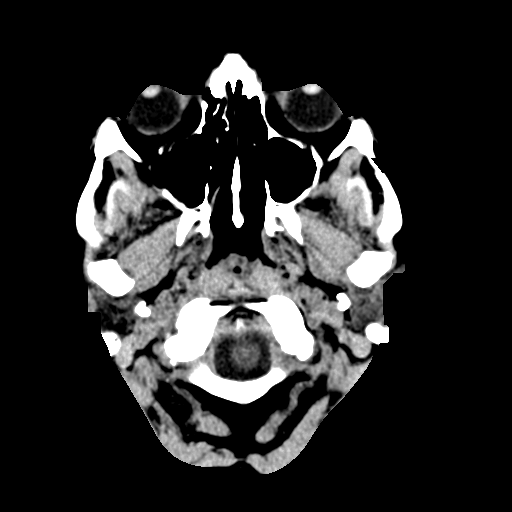
[im 3/32  bone]
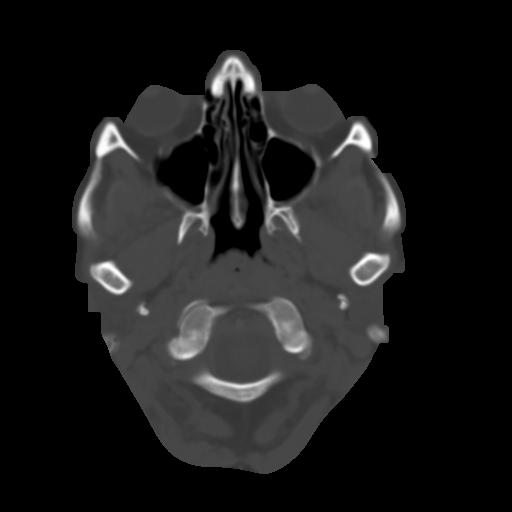
[im 6/32  brain]
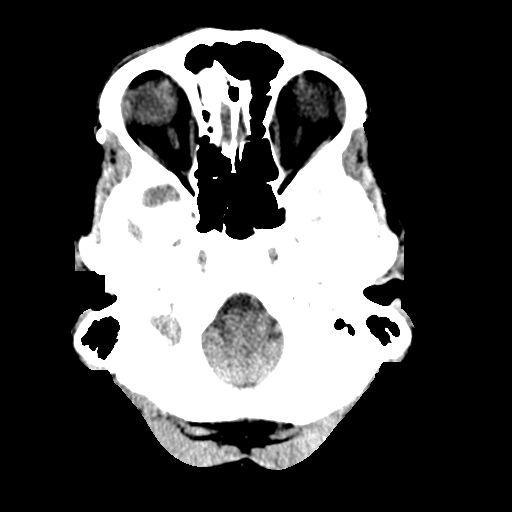
[im 9/32  brain]
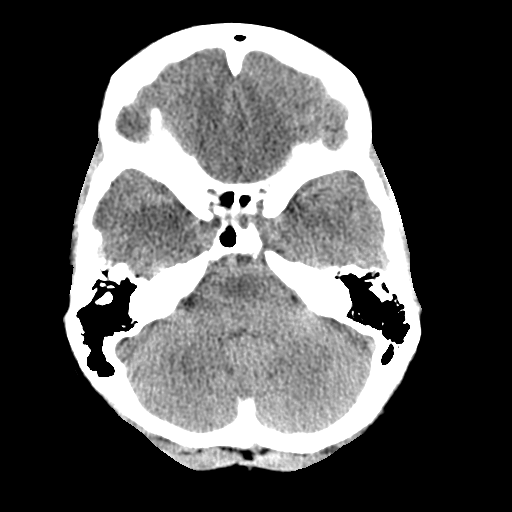
[im 11/32  brain]
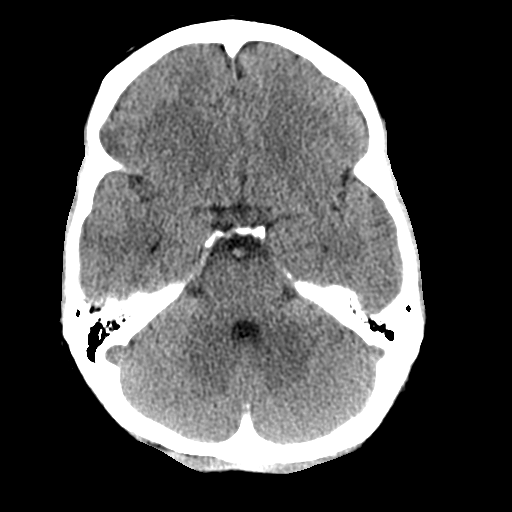
[im 14/32  brain]
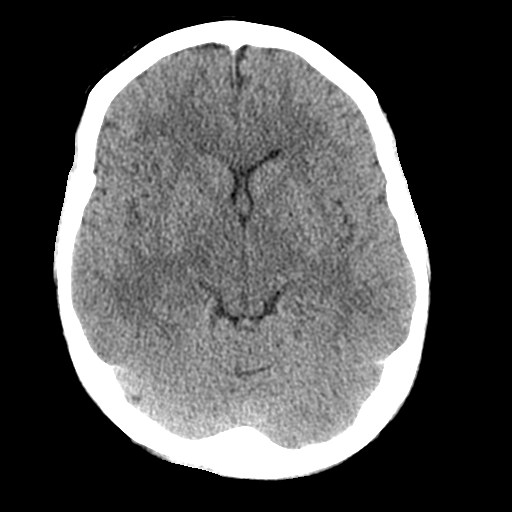
[im 14/32  bone]
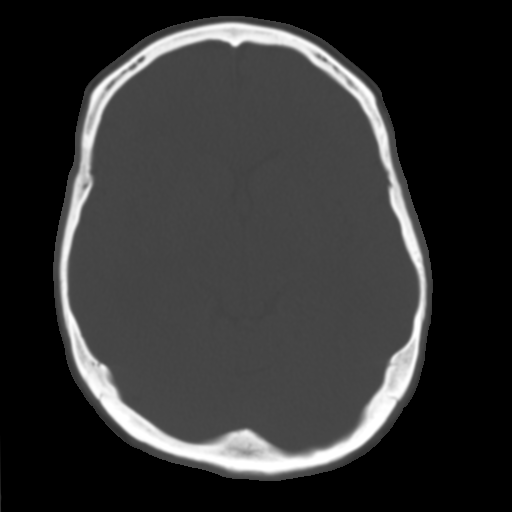
[im 18/32  brain]
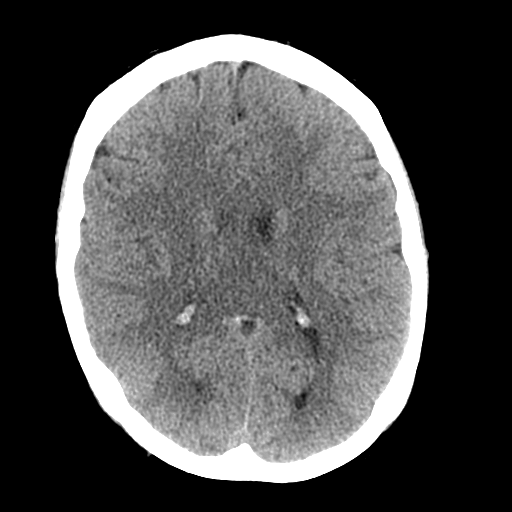
[im 21/32  brain]
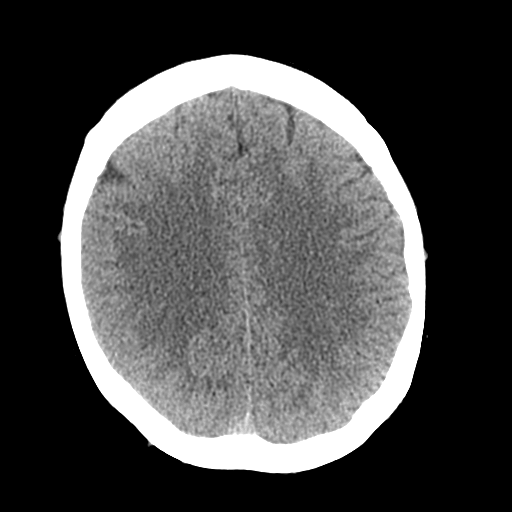
[im 24/32  brain]
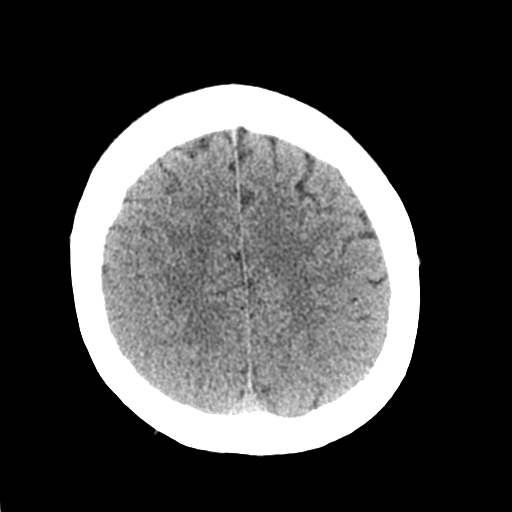
[im 26/32  brain]
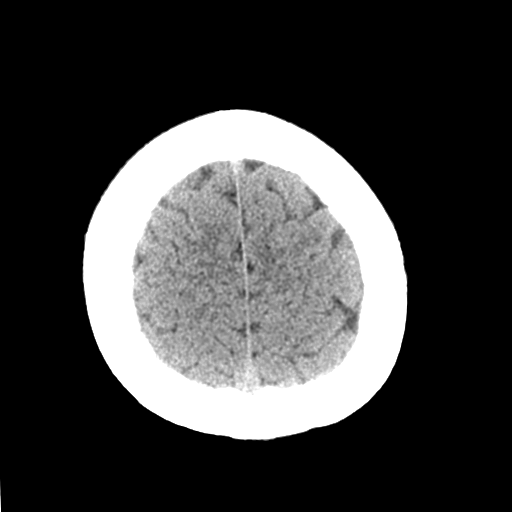
[im 26/32  bone]
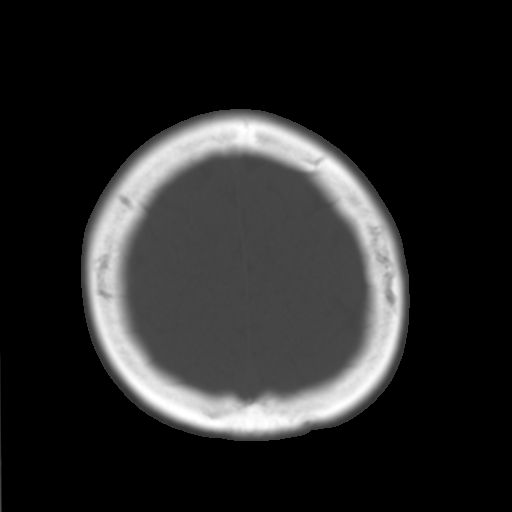
[im 29/32  brain]
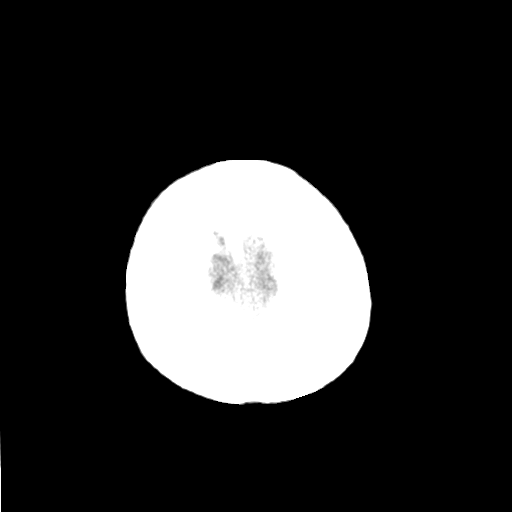

[Series 4: coronal soft · coronal · 0.33mm/px · 3 of 67 slices shown]
[im 23/67  brain]
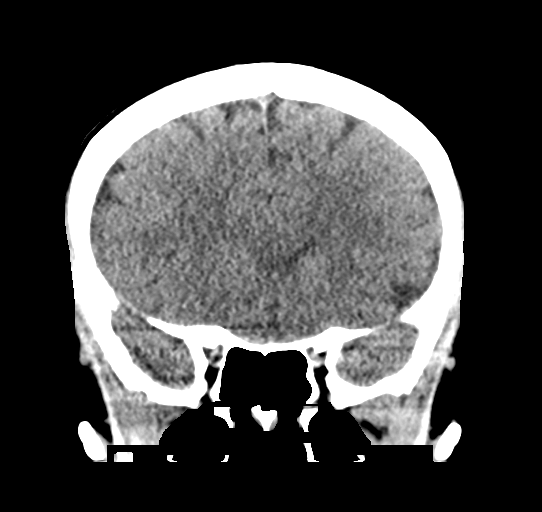
[im 30/67  brain]
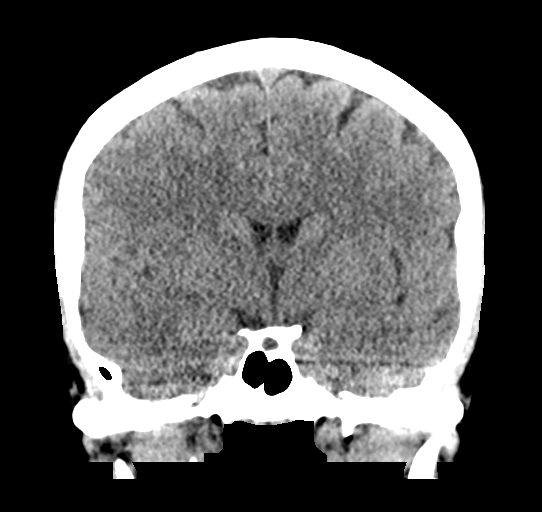
[im 37/67  brain]
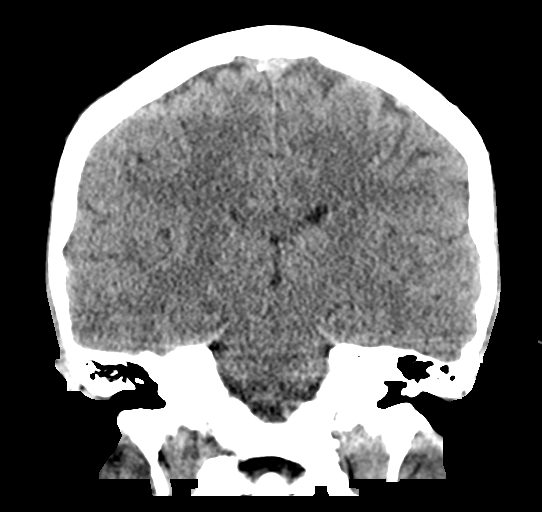

[Series 5: sagittal soft · sagittal · 0.36mm/px · 3 of 57 slices shown]
[im 19/57  brain]
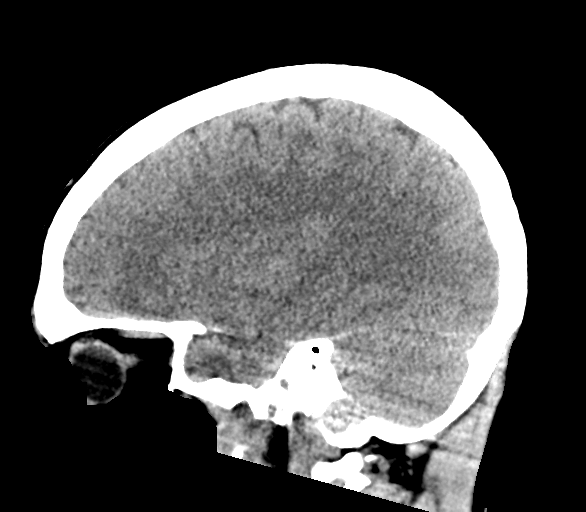
[im 29/57  brain]
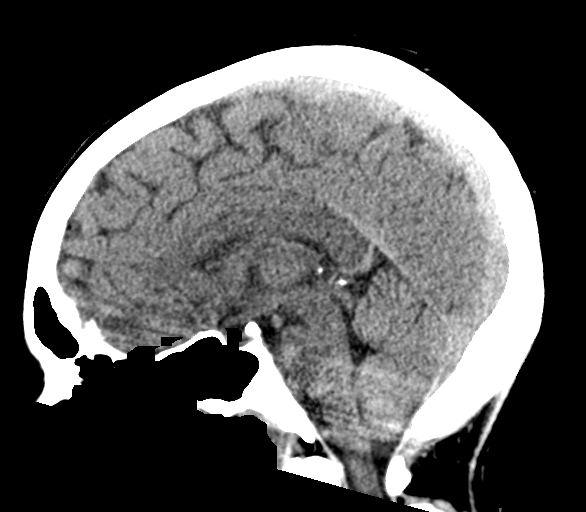
[im 38/57  brain]
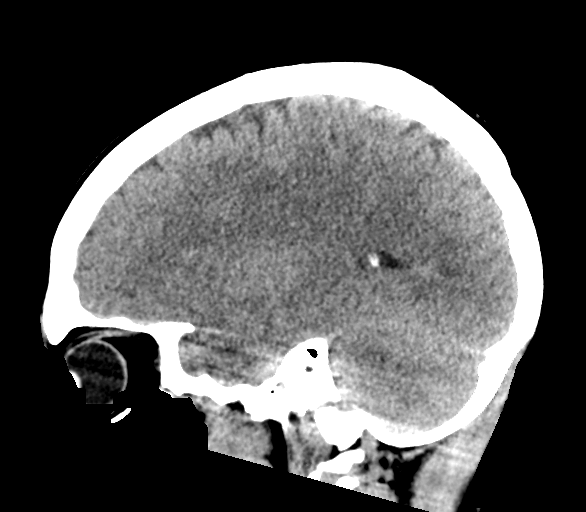

[16 of 47 positions shown; findings below may reference images not displayed]

FINDINGS: Brain:

Cerebral volume is normal.

Unchanged 4 mm hypodensity within the left caudate head, which may
reflect a prominent perivascular space or chronic lacunar infarct
(series 2, image 15) (series 5, image 33).

There is no acute intracranial hemorrhage.

No demarcated cortical infarct.

No extra-axial fluid collection.

No evidence of intracranial mass.

No midline shift.

Vascular: No hyperdense vessel.

Skull: Normal. Negative for fracture or focal lesion.

Sinuses/Orbits: Visualized orbits show no acute finding. No
significant paranasal sinus disease at the imaged levels.
IMPRESSION: No evidence of acute intracranial abnormality.

Unchanged 4 mm hypodensity within the left basal ganglia, which may
reflect a prominent perivascular space or chronic lacunar infarct.

## 2020-11-21 MED ORDER — ACETAMINOPHEN 500 MG PO TABS
1000.0000 mg | ORAL_TABLET | Freq: Once | ORAL | Status: AC
  Administered 2020-11-21: 1000 mg via ORAL
  Filled 2020-11-21: qty 2

## 2020-11-21 NOTE — ED Provider Notes (Signed)
[facility] EMERGENCY DEPARTMENT Provider Note   CSN: [id number] Arrival date & time: 11/21/20  1723     History Chief Complaint  Patient presents with  . Seizures    Whitney [name] is a 35 y.o. female.  HPI   This patient is a 35 year old female, she has a history of recently being diagnosed with some type of seizure-like activity.  She was actually seen in the emergency department at an outside hospital in May 2021 followed by outpatient evaluations with neurology and was diagnosed with having some type of partial seizures.  She reports that every time she has one of the seizures it appears to be her right upper extremity, initially there was fluctuating right upper extremity repetitive clonic movements, it lasted a couple minutes then resolved.  She was awake and alert through the whole event.  It has a occurred occasionally since that time and initially she was controlled on Tegretol however recently it was thought that she would have better control if she switched to Keppra.  Approximately 1 week ago she added Keppra, she did not decrease the dose of Tegretol as she is currently taking 500 mg twice a day.  Today she had an episode where she lost consciousness briefly, she stumbled and fell into a shelving unit.  She realized that she had no use of her right side, this lasted for a couple of minutes and then completely resolved.  She is now back to her baseline without any difficulty speaking, no visual changes, no pain, no numbness and is able to use both arms and legs.  When she discussed this with her neurologist by phone they recommended that she come to the emergency department immediately for CT scan of the brain.  At this time the patient not having any symptoms whatsoever.  Of note she does have a history of migraines that leaves her with a visual aura, she had neither headache nor aura today.  Past Medical History:  Diagnosis Date  . AVM (arteriovenous malformation)   .  Migraines     Patient Active Problem List   Diagnosis Date Noted  . Inflammation of operative incision 12/02/2017  . Umbilical pain 12/02/2017  . Request for sterilization 10/22/2017  . Postpartum care following vaginal delivery 10/21/2017    Past Surgical History:  Procedure Laterality Date  . DILATION AND CURETTAGE OF UTERUS    . TUBAL LIGATION Bilateral 10/22/2017   Procedure: POST PARTUM TUBAL LIGATION;  Surgeon: Natale Milch, MD;  Location: [facility] ORS;  Service: Gynecology;  Laterality: Bilateral;     OB History    Gravida  4   Para  3   Term  3   Preterm      AB  1   Living  3     SAB  1   IAB      Ectopic      Multiple  0   Live Births  3           Family History  Problem Relation Age of Onset  . Gout Father   . Hyperlipidemia Father   . Diabetes Other   . Cancer Other     Social History   Tobacco Use  . Smoking status: Former Games developer  . Smokeless tobacco: Never Used  Vaping Use  . Vaping Use: Never used  Substance Use Topics  . Alcohol use: No    Comment: occasional  . Drug use: No    Home Medications Prior to Admission  medications   Medication Sig Start Date End Date Taking? Authorizing Provider  acetaminophen (TYLENOL) 500 MG tablet Take 1,000 mg by mouth every 6 (six) hours as needed for moderate pain.    [provider]  Prenatal Vit-Fe Fumarate-FA (MULTIVITAMIN-PRENATAL) 27-0.8 MG TABS tablet Take 1 tablet by mouth daily at 12 noon.    [provider]    Allergies    Patient has no known allergies.  Review of Systems   Review of Systems  All other systems reviewed and are negative.   Physical Exam Updated Vital Signs BP 117/84 (BP Location: Right Arm)   Pulse 84   Temp 98.4 F (36.9 C) (Oral)   Resp 20   SpO2 98%   Physical Exam Vitals and nursing note reviewed.  Constitutional:      General: She is not in acute distress.    Appearance: She is well-developed and well-nourished.  HENT:      Head: Normocephalic and atraumatic.     Mouth/Throat:     Mouth: Oropharynx is clear and moist.     Pharynx: No oropharyngeal exudate.  Eyes:     General: No scleral icterus.       Right eye: No discharge.        Left eye: No discharge.     Extraocular Movements: EOM normal.     Conjunctiva/sclera: Conjunctivae normal.     Pupils: Pupils are equal, round, and reactive to light.  Neck:     Thyroid: No thyromegaly.     Vascular: No JVD.  Cardiovascular:     Rate and Rhythm: Normal rate and regular rhythm.     Pulses: Intact distal pulses.     Heart sounds: Normal heart sounds. No murmur heard. No friction rub. No gallop.   Pulmonary:     Effort: Pulmonary effort is normal. No respiratory distress.     Breath sounds: Normal breath sounds. No wheezing or rales.  Abdominal:     General: Bowel sounds are normal. There is no distension.     Palpations: Abdomen is soft. There is no mass.     Tenderness: There is no abdominal tenderness.  Musculoskeletal:        General: No tenderness or edema. Normal range of motion.     Cervical back: Normal range of motion and neck supple.     Right lower leg: No edema.     Left lower leg: No edema.  Lymphadenopathy:     Cervical: No cervical adenopathy.  Skin:    General: Skin is warm and dry.     Findings: No erythema or rash.  Neurological:     Mental Status: She is alert.     Coordination: Coordination normal.     Comments: Normal speech, coordintation, strength in all 4 ext and normal sensation diffusely - CN 3-12 normal.  Psychiatric:        Mood and Affect: Mood and affect normal.        Behavior: Behavior normal.     ED Results / Procedures / Treatments   Labs (all labs ordered are listed, but only abnormal results are displayed) Labs Reviewed  BASIC METABOLIC PANEL - Abnormal; Notable for the following components:      Result Value   Glucose, Bld 68 (*)    All other components within normal limits  URINALYSIS, ROUTINE W  REFLEX MICROSCOPIC - Abnormal; Notable for the following components:   Hgb urine dipstick SMALL (*)    All other components within  normal limits  CBC  PREGNANCY, URINE    EKG None  Radiology CT Head Wo Contrast  Result Date: 11/21/2020 CLINICAL DATA:  Neuro deficit, acute, stroke suspected. Additional history provided: Patient reports history of seizures, feels as though she has had a seizure today. EXAM: CT HEAD WITHOUT CONTRAST TECHNIQUE: Contiguous axial images were obtained from the base of the skull through the vertex without intravenous contrast. COMPARISON:  Prior head CT examinations 03/21/2020 and earlier. FINDINGS: Brain: Cerebral volume is normal. Unchanged 4 mm hypodensity within the left caudate head, which may reflect a prominent perivascular space or chronic lacunar infarct (series 2, image 15) (series 5, image 33). There is no acute intracranial hemorrhage. No demarcated cortical infarct. No extra-axial fluid collection. No evidence of intracranial mass. No midline shift. Vascular: No hyperdense vessel. Skull: Normal. Negative for fracture or focal lesion. Sinuses/Orbits: Visualized orbits show no acute finding. No significant paranasal sinus disease at the imaged levels. IMPRESSION: No evidence of acute intracranial abnormality. Unchanged 4 mm hypodensity within the left basal ganglia, which may reflect a prominent perivascular space or chronic lacunar infarct. Electronically Signed   By: Jackey Loge DO   On: 11/21/2020 18:11    Procedures Procedures   Medications Ordered in ED Medications - No data to display  ED Course  I have reviewed the triage vital signs and the nursing notes.  Pertinent labs & imaging results that were available during my care of the patient were reviewed by me and considered in my medical decision making (see chart for details).    MDM Rules/Calculators/A&P                          The patient has a normal exam, normal vital signs and by  history has a history of some type of partial seizures.  I do not see any focal neurological notes unfortunately.  She reports that she is seen by Dr. Gerilyn Pilgrim locally.  I will perform a CT scan and some basic labs however this may have just been another partial seizure.  At this time she appears stable, no signs of acute stroke and very low risk for the same.  D/w Neuro at [facility] - no acute changes are necesasry to the home regimen - close outpt f/u.  Final Clinical Impression(s) / ED Diagnoses Final diagnoses:  Focal seizure (HCC)      Eber Hong, MD 11/21/20 [phone]

## 2020-11-21 NOTE — ED Triage Notes (Signed)
Pt reports "feeling funny and not feeling like myself." Pt reports a Hx of seizures. MD Hyacinth Meeker at the bedside. No seizures observed at this time. Pt speaking in full sentences. Pt Alert and oriented.

## 2020-11-21 NOTE — Discharge Instructions (Signed)
Your testing today has been reassuring.  There is no signs of infections, tumors, aneurysms, strokes, bleeding or any other significant abnormalities on the CT scan!  I would encourage you to follow-up closely with your neurologist this week.  The neurologist that I spoke with tonight recommended that you not change any of the doses of your medications since you just increased your dose of Keppra.  Please continue to take 500 mg of Keppra twice a day.  If you should develop severe or worsening or recurrent symptoms please return to the emergency department immediately

## 2021-06-18 ENCOUNTER — Other Ambulatory Visit (HOSPITAL_COMMUNITY): Payer: Self-pay | Admitting: Neurology

## 2021-06-18 ENCOUNTER — Other Ambulatory Visit: Payer: Self-pay | Admitting: Neurology

## 2021-06-18 DIAGNOSIS — G35 Multiple sclerosis: Secondary | ICD-10-CM

## 2021-07-03 ENCOUNTER — Ambulatory Visit (HOSPITAL_COMMUNITY): Payer: Medicaid Other

## 2021-07-16 ENCOUNTER — Ambulatory Visit (HOSPITAL_COMMUNITY)
Admission: RE | Admit: 2021-07-16 | Discharge: 2021-07-16 | Disposition: A | Discharge status: Home / Self Care | Payer: Medicaid Other | Source: Ambulatory Visit | Attending: Neurology | Admitting: Neurology

## 2021-07-16 ENCOUNTER — Other Ambulatory Visit: Payer: Self-pay

## 2021-07-16 DIAGNOSIS — G35 Multiple sclerosis: Secondary | ICD-10-CM

## 2021-07-16 IMAGING — MR MR CERVICAL SPINE WO/W CM
5 of 8 series · 27 of 48 positions shown · IV contrast (gadavist)
Comparison: Prior head CT from [DATE].

CLINICAL DATA: Initial evaluation for history of MS and seizures.

EXAM:
MRI HEAD WITHOUT AND WITH CONTRAST
MRI CERVICAL SPINE WITHOUT AND WITH CONTRAST
TECHNIQUE: Multiplanar, multiecho pulse sequences of the brain and surrounding
structures, and cervical spine, to include the craniocervical
junction and cervicothoracic junction, were obtained without and
with intravenous contrast.
CONTRAST:  5mL GADAVIST GADOBUTROL 1 MMOL/ML IV SOLN

[Series 5: T1 · sagittal · 3.0mm · 0.86mm/px · 4 of 15 slices shown (1 of 2)]
[im 1/15]
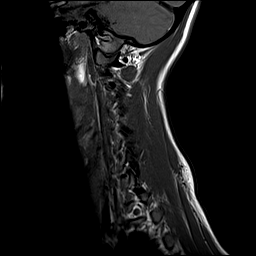
[im 5/15]
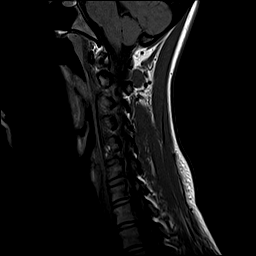
[im 10/15]
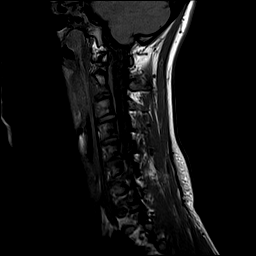
[im 15/15]
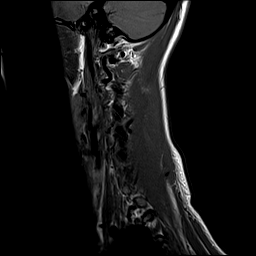

[Series 7: T2 · axial · 3.0mm · 0.70mm/px · z∈[-176,-57]mm · 8 of 36 slices shown (1 of 2)]
[im 1/36]
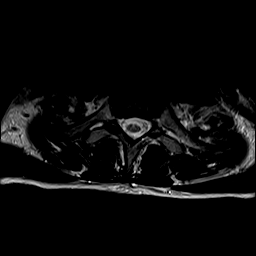
[im 6/36]
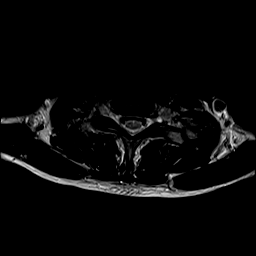
[im 11/36]
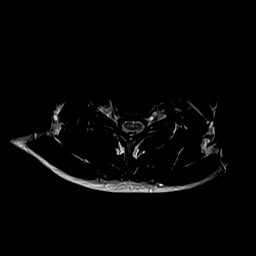
[im 16/36]
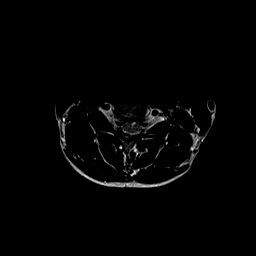
[im 21/36]
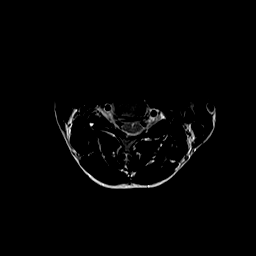
[im 26/36]
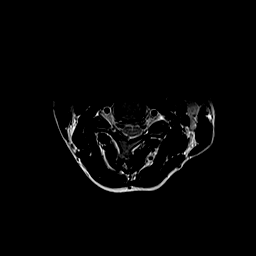
[im 31/36]
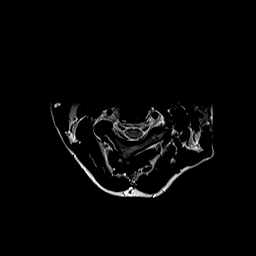
[im 36/36]
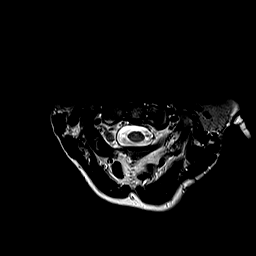

[Series 9: T1 · axial · 3.0mm · 0.35mm/px · z∈[-176,-57]mm · 8 of 36 slices shown (2 of 2)]
[im 1/36]
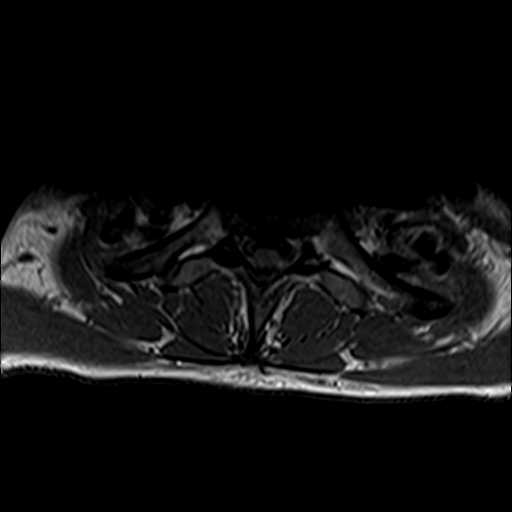
[im 6/36]
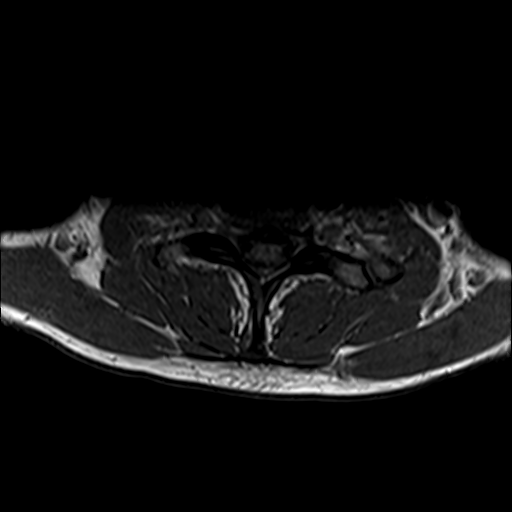
[im 11/36]
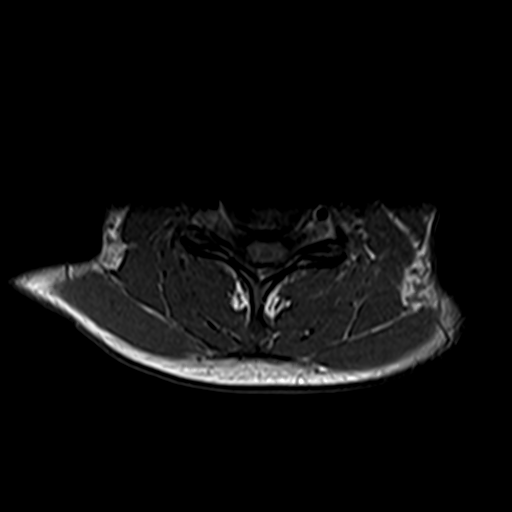
[im 16/36]
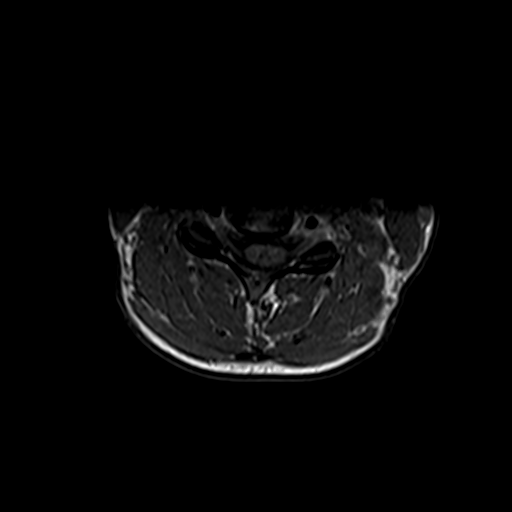
[im 21/36]
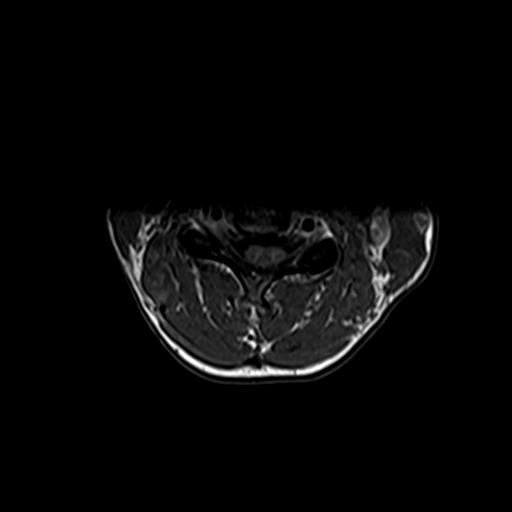
[im 26/36]
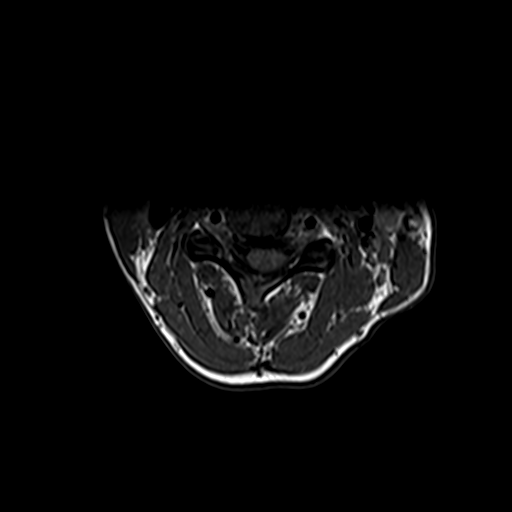
[im 31/36]
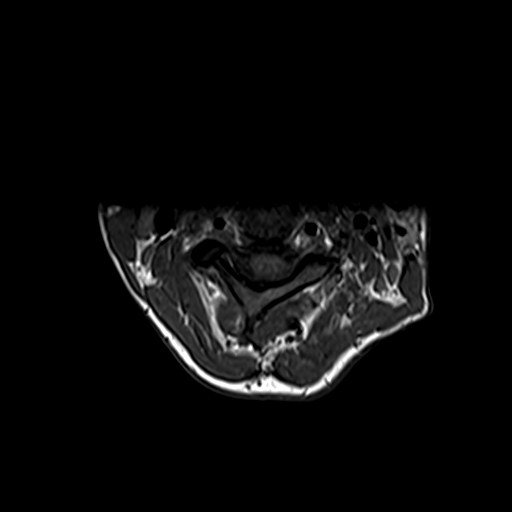
[im 36/36]
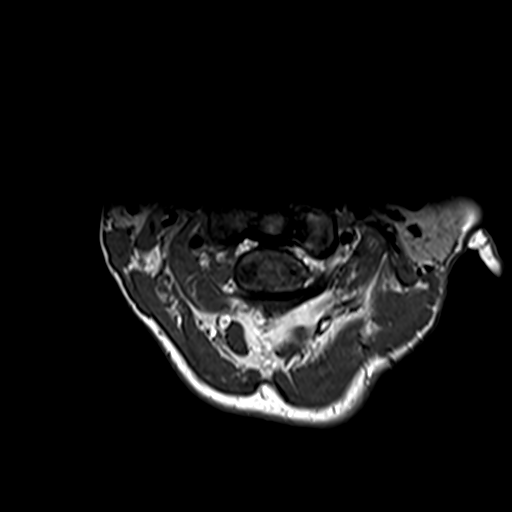

[Series 10: T2 · sagittal · 3.0mm · 0.69mm/px · 4 of 15 slices shown (2 of 2)]
[im 1/15]
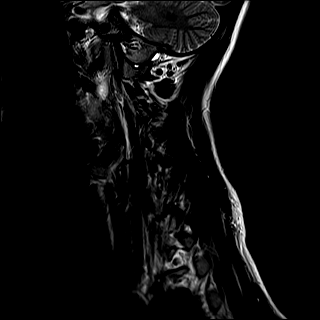
[im 5/15]
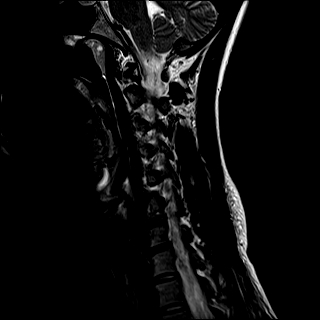
[im 10/15]
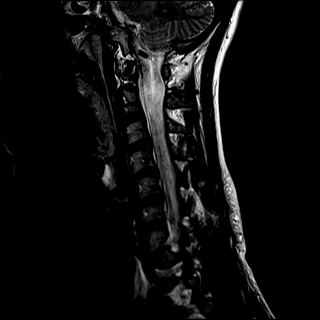
[im 15/15]
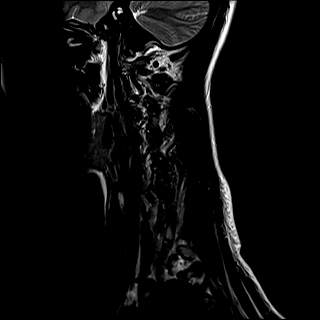

[Series 11: T1 fat-sat post-contrast · sagittal · 3.0mm · 0.43mm/px · 3 of 15 slices shown]
[im 1/15]
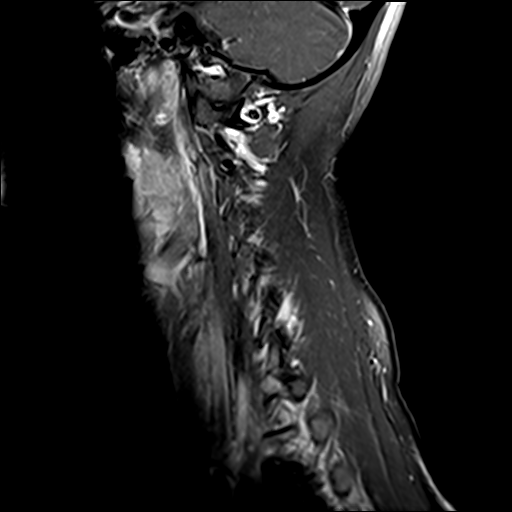
[im 5/15]
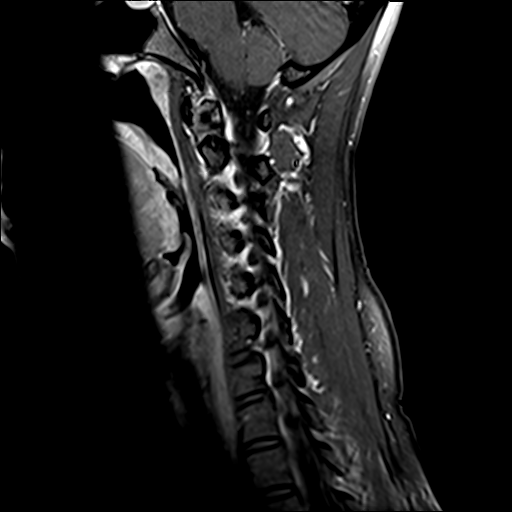
[im 10/15]
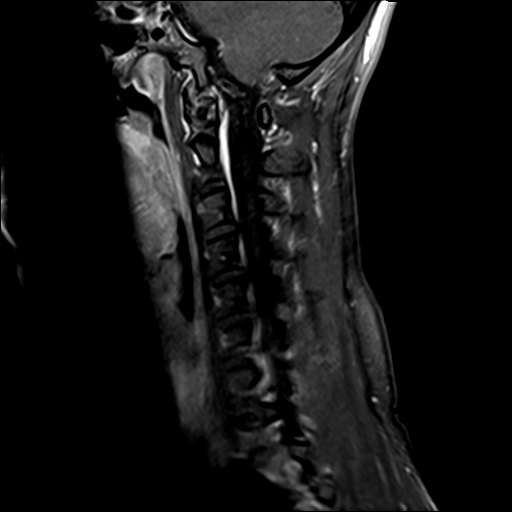

[27 of 48 positions shown; findings below may reference images not displayed]

FINDINGS: MRI HEAD FINDINGS

Brain: Cerebral volume within normal limits. Small 5 mm focus of
FLAIR signal abnormality involving the left caudate head most
characteristic of a tiny remote lacunar infarct (series 16, image
29). This corresponds with hyperdensity seen on prior CT. There are
a few additional scattered subcentimeter foci of T2/FLAIR signal
abnormality involving the periventricular, deep, and juxta cortical
white matter of both cerebral hemispheres, most prominent of which
seen on axial FLAIR sequence, series 16, images 33, 28, 27). These
foci are nonspecific. No significant callososeptal involvement. No
visible foci radiating in a perpendicular fashion from the lateral
ventricles. No infratentorial signal abnormality. No associated
diffusion abnormality or enhancement to suggest active
demyelination.

No evidence for acute or subacute infarct. Gray-white matter
differentiation maintained. No encephalomalacia to suggest chronic
cortical infarction. No evidence for acute or chronic intracranial
hemorrhage.

No mass lesion, midline shift or mass effect. No hydrocephalus or
extra-axial fluid collection. Pituitary gland suprasellar region
normal. Midline structures intact and normal. No visible intrinsic
temporal lobe abnormality. No other abnormal enhancement.

Vascular: Major intracranial vascular flow voids are well
maintained.

Skull and upper cervical spine: Craniocervical junction within
normal limits. Bone marrow signal intensity normal. No scalp soft
tissue abnormality.

Sinuses/Orbits: Globes and orbital soft tissues within normal
limits. Paranasal sinuses are largely clear. No mastoid effusion.
Inner ear structures grossly normal.

Other: None.

MRI CERVICAL SPINE FINDINGS

Alignment: Straightening with slight reversal of the normal cervical
lordosis. No listhesis.

Vertebrae: Vertebral body height maintained without acute or chronic
fracture. Bone marrow signal intensity within normal limits. No
discrete or worrisome osseous lesions. No abnormal marrow edema or
enhancement.

Cord: Normal signal morphology. No cord signal changes to suggest
demyelinating disease. No abnormal enhancement.

Posterior Fossa, vertebral arteries, paraspinal tissues:
Craniocervical junction normal. Paraspinous and prevertebral soft
tissues within normal limits. Normal flow voids seen within the
vertebral arteries bilaterally.

Disc levels:

C2-C3: Unremarkable.

C3-C4:  Unremarkable.

C4-C5:  Minimal annular disc bulge.  No canal or foraminal stenosis.

C5-C6:  Minimal annular disc bulge.  No canal or foraminal stenosis.

C6-C7:  Minimal annular disc bulge.  No canal or foraminal stenosis.

C7-T1:  Unremarkable.

Visualized upper thoracic spine demonstrates no significant finding.
IMPRESSION: MRI HEAD IMPRESSION:

1. No acute intracranial abnormality.
2. 5 mm remote lacunar infarct at the left caudate head,
corresponding with punctate hypodensity seen on prior CT.
3. Few scattered foci of T2/FLAIR signal abnormality involving the
periventricular, deep, and juxta cortical white matter both cerebral
hemispheres. Overall, these changes are nonspecific, but mild in
nature. Primary differential considerations include sequelae of
chronic microvascular ischemic disease, complicated migraines, or
possibly demyelinating disease, although appearance would be
atypical for this process. No evidence for active demyelination.
4. Otherwise normal brain MRI.

MRI CERVICAL SPINE IMPRESSION:

1. Normal MRI appearance of the cervical spinal cord. No evidence
for demyelinating disease or abnormal enhancement.
2. Mild noncompressive disc bulging at C4-5 through C6-7 without
stenosis or neural impingement.

## 2021-07-16 IMAGING — MR MR HEAD WO/W CM
16 of 17 series · 39 of 48 positions shown · IV contrast (gadavist)
Comparison: Prior head CT from [DATE].

CLINICAL DATA: Initial evaluation for history of MS and seizures.

EXAM:
MRI HEAD WITHOUT AND WITH CONTRAST
MRI CERVICAL SPINE WITHOUT AND WITH CONTRAST
TECHNIQUE: Multiplanar, multiecho pulse sequences of the brain and surrounding
structures, and cervical spine, to include the craniocervical
junction and cervicothoracic junction, were obtained without and
with intravenous contrast.
CONTRAST:  5mL GADAVIST GADOBUTROL 1 MMOL/ML IV SOLN

[Series 5: DWI · axial · 3.0mm · 0.77mm/px · z∈[-35,+108]mm · 2 of 50 slices shown (1 of 4)]
[im 1/50]
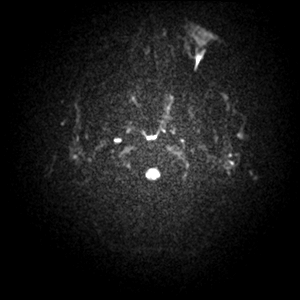
[im 50/50]
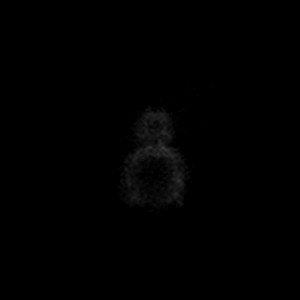

[Series 6: DWI · axial · 3.0mm · 0.77mm/px · z∈[-35,+108]mm · 3 of 50 slices shown (2 of 4)]
[im 1/50]
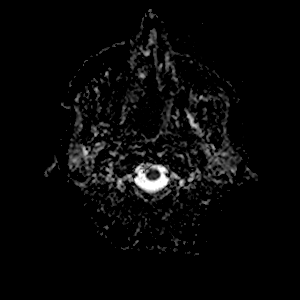
[im 25/50]
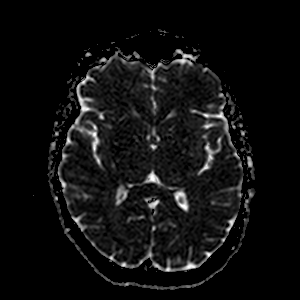
[im 50/50]
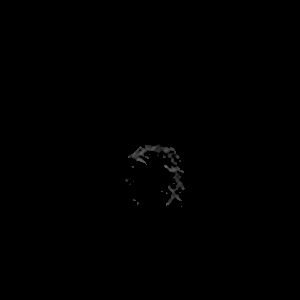

[Series 7: DWI · coronal · 5.0mm · 0.88mm/px · 2 of 28 slices shown (3 of 4)]
[im 1/28]
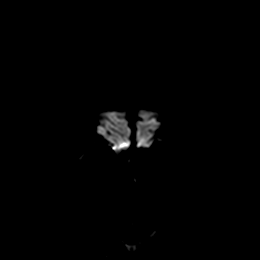
[im 28/28]
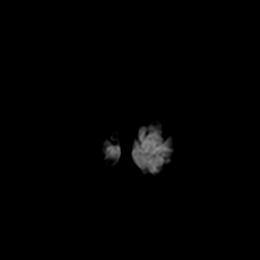

[Series 8: DWI · coronal · 5.0mm · 0.88mm/px · 2 of 28 slices shown (4 of 4)]
[im 1/28]
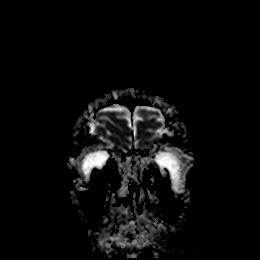
[im 28/28]
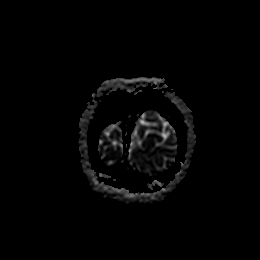

[Series 9: T1 · sagittal · 5.0mm · 0.75mm/px · 1 of 21 slices shown (1 of 2)]
[im 1/21]
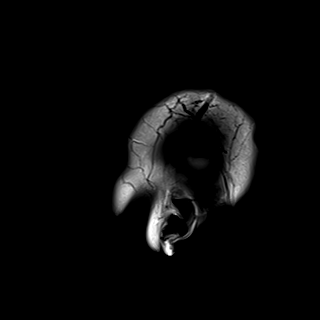

[Series 10: FLAIR · sagittal · 5.0mm · 0.94mm/px · 1 of 21 slices shown (1 of 3)]
[im 1/21]
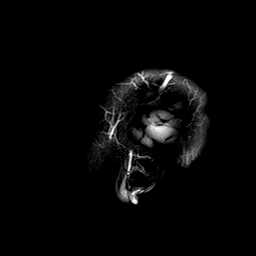

[Series 11: T2 · axial · 5.0mm · 0.72mm/px · 1 of 23 slices shown (1 of 3)]
[im 1/23]
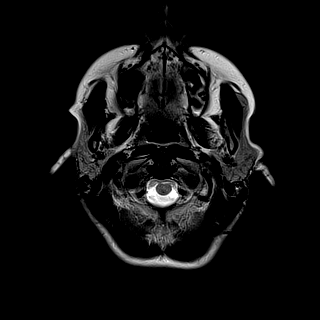

[Series 12: mag_images · axial · 3.0mm · 0.90mm/px · z∈[-54,+118]mm · 3 of 60 slices shown]
[im 1/60]
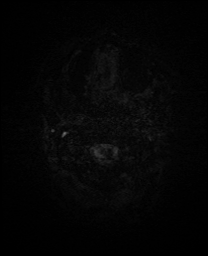
[im 30/60]
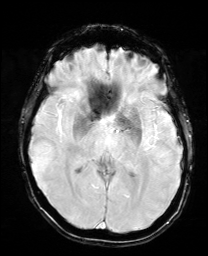
[im 60/60]
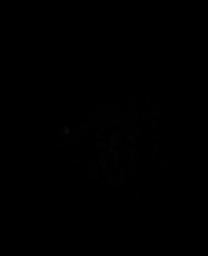

[Series 13: pha_images · axial · 3.0mm · 0.90mm/px · z∈[-54,+118]mm · 3 of 58 slices shown]
[im 1/58]
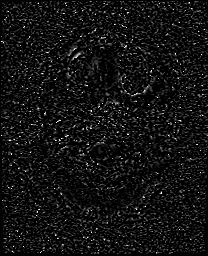
[im 29/58]
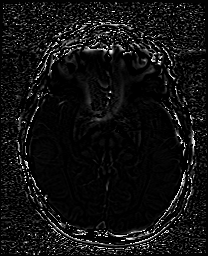
[im 58/58]
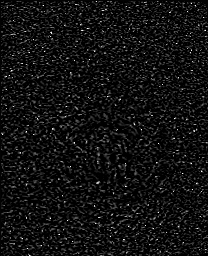

[Series 14: swi_images · axial · 3.0mm · 0.90mm/px · z∈[-54,+118]mm · 3 of 60 slices shown]
[im 1/60]
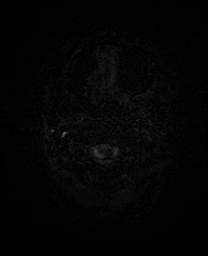
[im 30/60]
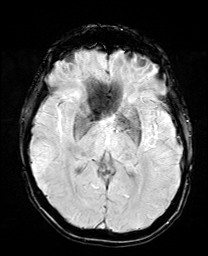
[im 60/60]
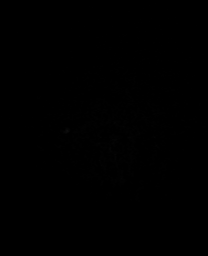

[Series 16: FLAIR · axial · 3.0mm · 0.45mm/px · z∈[-36,+107]mm · 3 of 50 slices shown (2 of 3)]
[im 1/50]
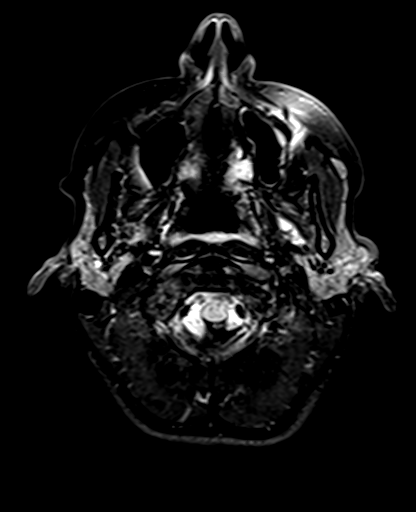
[im 25/50]
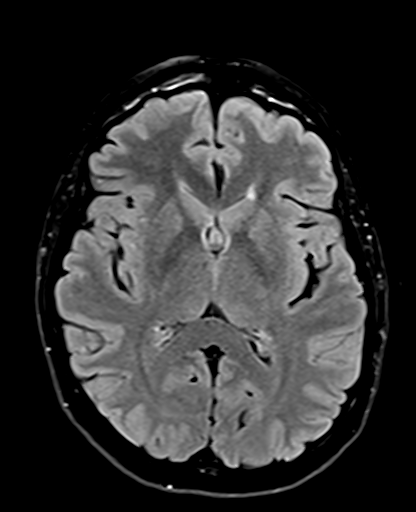
[im 50/50]
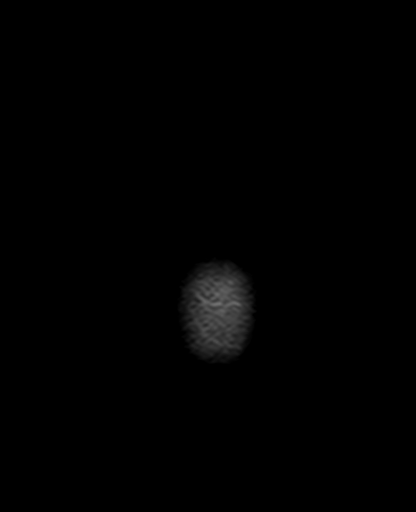

[Series 17: T1 · axial · 1.0mm · 0.98mm/px · z∈[-50,+116]mm · 9 of 172 slices shown (2 of 2)]
[im 1/172]
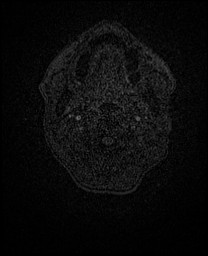
[im 22/172]
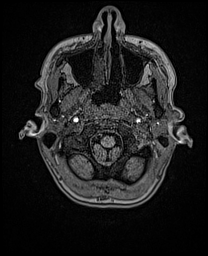
[im 43/172]
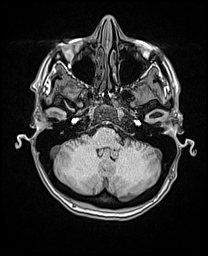
[im 65/172]
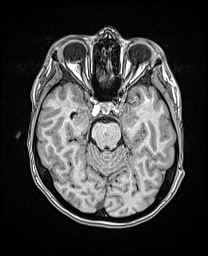
[im 86/172]
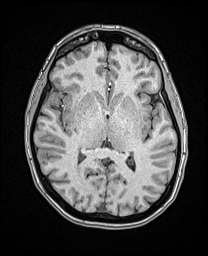
[im 107/172]
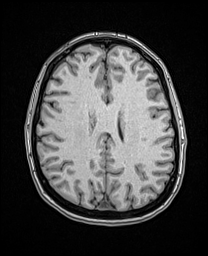
[im 129/172]
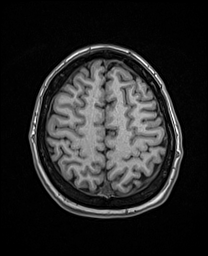
[im 150/172]
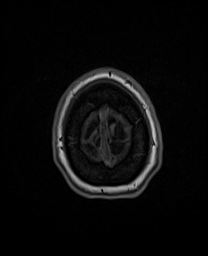
[im 172/172]
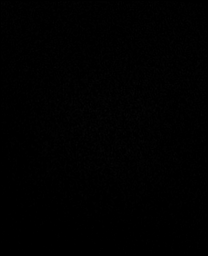

[Series 18: T2 · coronal · 5.0mm · 0.72mm/px · 2 of 28 slices shown (2 of 3)]
[im 1/28]
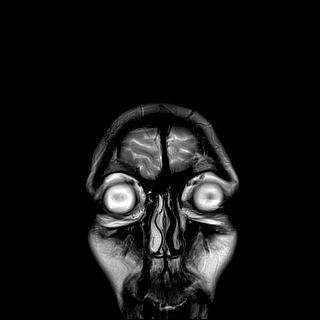
[im 28/28]
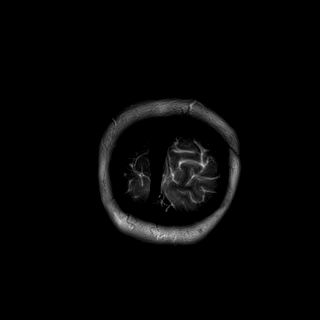

[Series 19: FLAIR · coronal · 3.0mm · 0.56mm/px · 1 of 23 slices shown (3 of 3)]
[im 1/23]
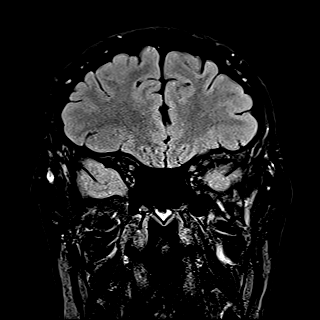

[Series 20: T2 · coronal · 3.0mm · 0.27mm/px · 1 of 23 slices shown (3 of 3)]
[im 1/23]
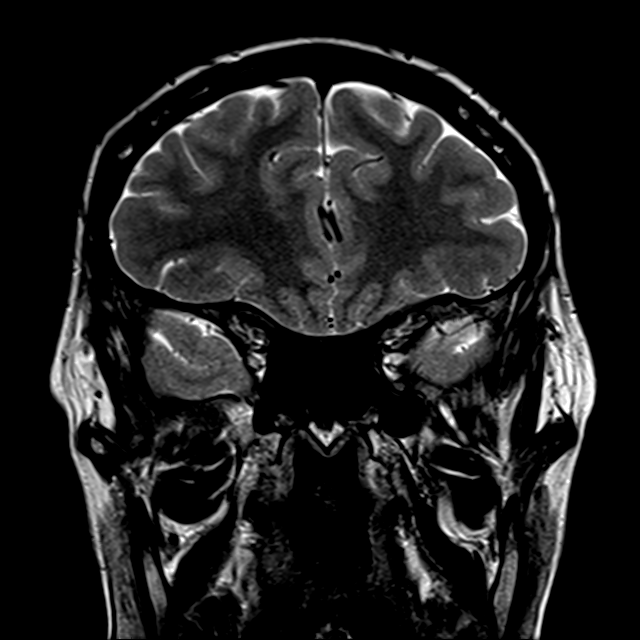

[Series 22: T1 post-contrast · coronal · 5.0mm · 0.34mm/px · 2 of 28 slices shown]
[im 1/28]
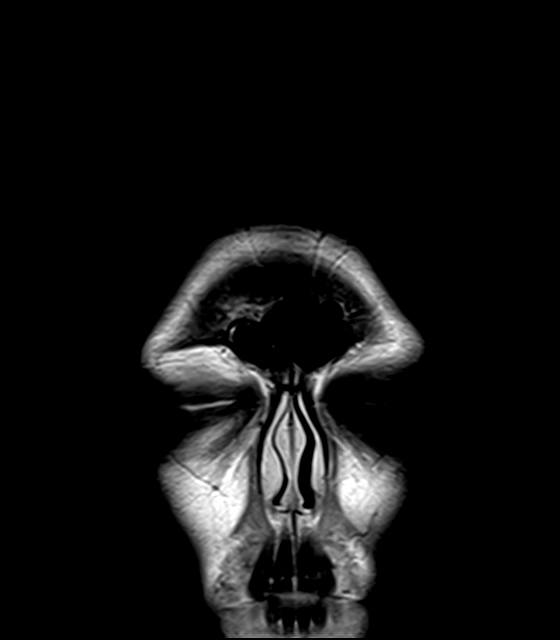
[im 28/28]
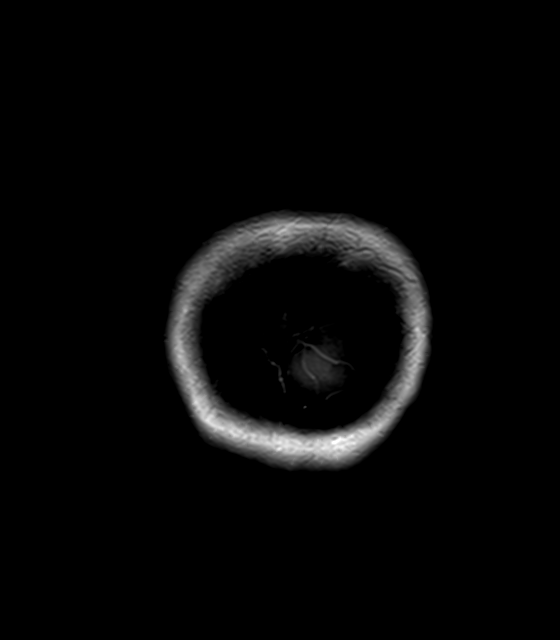

[39 of 48 positions shown; findings below may reference images not displayed]

FINDINGS: MRI HEAD FINDINGS

Brain: Cerebral volume within normal limits. Small 5 mm focus of
FLAIR signal abnormality involving the left caudate head most
characteristic of a tiny remote lacunar infarct (series 16, image
29). This corresponds with hyperdensity seen on prior CT. There are
a few additional scattered subcentimeter foci of T2/FLAIR signal
abnormality involving the periventricular, deep, and juxta cortical
white matter of both cerebral hemispheres, most prominent of which
seen on axial FLAIR sequence, series 16, images 33, 28, 27). These
foci are nonspecific. No significant callososeptal involvement. No
visible foci radiating in a perpendicular fashion from the lateral
ventricles. No infratentorial signal abnormality. No associated
diffusion abnormality or enhancement to suggest active
demyelination.

No evidence for acute or subacute infarct. Gray-white matter
differentiation maintained. No encephalomalacia to suggest chronic
cortical infarction. No evidence for acute or chronic intracranial
hemorrhage.

No mass lesion, midline shift or mass effect. No hydrocephalus or
extra-axial fluid collection. Pituitary gland suprasellar region
normal. Midline structures intact and normal. No visible intrinsic
temporal lobe abnormality. No other abnormal enhancement.

Vascular: Major intracranial vascular flow voids are well
maintained.

Skull and upper cervical spine: Craniocervical junction within
normal limits. Bone marrow signal intensity normal. No scalp soft
tissue abnormality.

Sinuses/Orbits: Globes and orbital soft tissues within normal
limits. Paranasal sinuses are largely clear. No mastoid effusion.
Inner ear structures grossly normal.

Other: None.

MRI CERVICAL SPINE FINDINGS

Alignment: Straightening with slight reversal of the normal cervical
lordosis. No listhesis.

Vertebrae: Vertebral body height maintained without acute or chronic
fracture. Bone marrow signal intensity within normal limits. No
discrete or worrisome osseous lesions. No abnormal marrow edema or
enhancement.

Cord: Normal signal morphology. No cord signal changes to suggest
demyelinating disease. No abnormal enhancement.

Posterior Fossa, vertebral arteries, paraspinal tissues:
Craniocervical junction normal. Paraspinous and prevertebral soft
tissues within normal limits. Normal flow voids seen within the
vertebral arteries bilaterally.

Disc levels:

C2-C3: Unremarkable.

C3-C4:  Unremarkable.

C4-C5:  Minimal annular disc bulge.  No canal or foraminal stenosis.

C5-C6:  Minimal annular disc bulge.  No canal or foraminal stenosis.

C6-C7:  Minimal annular disc bulge.  No canal or foraminal stenosis.

C7-T1:  Unremarkable.

Visualized upper thoracic spine demonstrates no significant finding.
IMPRESSION: MRI HEAD IMPRESSION:

1. No acute intracranial abnormality.
2. 5 mm remote lacunar infarct at the left caudate head,
corresponding with punctate hypodensity seen on prior CT.
3. Few scattered foci of T2/FLAIR signal abnormality involving the
periventricular, deep, and juxta cortical white matter both cerebral
hemispheres. Overall, these changes are nonspecific, but mild in
nature. Primary differential considerations include sequelae of
chronic microvascular ischemic disease, complicated migraines, or
possibly demyelinating disease, although appearance would be
atypical for this process. No evidence for active demyelination.
4. Otherwise normal brain MRI.

MRI CERVICAL SPINE IMPRESSION:

1. Normal MRI appearance of the cervical spinal cord. No evidence
for demyelinating disease or abnormal enhancement.
2. Mild noncompressive disc bulging at C4-5 through C6-7 without
stenosis or neural impingement.

## 2021-07-16 MED ORDER — GADOBUTROL 1 MMOL/ML IV SOLN
5.0000 mL | Freq: Once | INTRAVENOUS | Status: AC | PRN
  Administered 2021-07-16: 5 mL via INTRAVENOUS
